# Patient Record
Sex: Male | Born: 1965 | Race: Black or African American | Hispanic: No | Marital: Single
Health system: Southern US, Community
[De-identification: ages and names within clinical notes are randomized; demographics above are authoritative.]

## PROBLEM LIST (undated history)

## (undated) DIAGNOSIS — I10 Essential (primary) hypertension: Secondary | ICD-10-CM

## (undated) HISTORY — PX: SHOULDER SURGERY: SHX246

---

## 2004-09-13 ENCOUNTER — Emergency Department (HOSPITAL_COMMUNITY): Admission: EM | Admit: 2004-09-13 | Discharge: 2004-09-14 | Payer: Self-pay | Admitting: Emergency Medicine

## 2006-07-04 ENCOUNTER — Emergency Department (HOSPITAL_COMMUNITY): Admission: EM | Admit: 2006-07-04 | Discharge: 2006-07-04 | Payer: Self-pay | Admitting: Emergency Medicine

## 2006-09-10 ENCOUNTER — Emergency Department (HOSPITAL_COMMUNITY): Admission: EM | Admit: 2006-09-10 | Discharge: 2006-09-11 | Payer: Self-pay | Admitting: Emergency Medicine

## 2007-01-25 ENCOUNTER — Ambulatory Visit: Payer: Self-pay | Admitting: Hospitalist

## 2007-01-25 ENCOUNTER — Inpatient Hospital Stay (HOSPITAL_COMMUNITY): Admission: EM | Admit: 2007-01-25 | Discharge: 2007-01-29 | Payer: Self-pay | Admitting: Emergency Medicine

## 2007-01-26 ENCOUNTER — Encounter (INDEPENDENT_AMBULATORY_CARE_PROVIDER_SITE_OTHER): Payer: Self-pay | Admitting: Hospitalist

## 2007-01-26 ENCOUNTER — Ambulatory Visit: Payer: Self-pay | Admitting: Surgery

## 2007-05-14 ENCOUNTER — Emergency Department (HOSPITAL_COMMUNITY): Admission: EM | Admit: 2007-05-14 | Discharge: 2007-05-15 | Payer: Self-pay | Admitting: Emergency Medicine

## 2008-12-26 ENCOUNTER — Emergency Department (HOSPITAL_COMMUNITY): Admission: EM | Admit: 2008-12-26 | Discharge: 2008-12-26 | Payer: Self-pay | Admitting: Emergency Medicine

## 2010-10-15 NOTE — Discharge Summary (Signed)
Donald French, Donald French              ACCOUNT NO.:  [id number]   MEDICAL RECORD NO.:  [id number]          PATIENT TYPE:  INP   LOCATION:  5019                         FACILITY:  [facility]   PHYSICIAN:  Fransisco Hertz, M.D.  DATE OF BIRTH:  06-Jan-1966   DATE OF ADMISSION:  01/25/2007  DATE OF DISCHARGE:  01/29/2007                               DISCHARGE SUMMARY   Left against medical advice on January 29, 2007.   DISCHARGE DIAGNOSES:  1. Methicillin-resistant Staphylococcus aureus positive abscess on      right knee.  2. Pseudogout.   MEDICATIONS:  The patient was supposed to complete a course of  doxycycline for his methicillin-resistant Staphylococcus aureus positive  abscess.   DISPOSITION AND FOLLOWUP:  The patient left against medical advice on  January 29, 2007.  We were planning to send him home on oral  antibiotics after doxycycline for his methicillin-resistant  Staphylococcus aureus positive abscess on the following day.   PROCEDURES:  1. The patient underwent diagnostic radiographic examination of his      right knee on January 25, 2007.  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  2. The patient underwent an MRI of the right knee without contrast on      January 25, 2007.  Impression:  1) hemorrhage into patellar bursa,      (2) small radial tear centrally in the body of the lateral      meniscus, (3) bipartite patella, (4) tiny Baker's cyst with a small      loose body within it, (5) tricompartmental degenerative disease.  3. A Doppler ultrasound of the right lower extremity was performed to      rule out deep venous thrombosis.  The results came back to be      negative.   CONSULTATIONS:  Orthopaedic consultation was done with Dr. Priscille Kluver.  Incision and  drainage of the abscess on the right knee was done, which  later grew methicillin-resistant Staphylococcus aureus positive culture.  A joint aspiration was also done at the time.  Fine needle fluid  examination from joint aspiration showed the following results:  Color  red, appearance bloody, WBC not done because the patient clotted,  neutrophils 99, lymphocytes 1, __________, eosinophils zero, bacteria  noted, crystalloid __________ intracellular calcium 500 crystals as well  as extracellular calcium 500 crystals.   BRIEF ADMISSION HISTORY AND PHYSICAL:  Donald French is a 45 year old  gentleman who presented to the emergency department on the day of  admission with 3-week history of right leg swelling.  The swelling  started when he got hit on the right knee by a metal grill while he was  trying to load it into his truck about 3 weeks ago.  He thought that it  would subside on its own, so did not think it was necessary for him to  come to the hospital.  The swelling had remained more or less persistent  until the day of admission when he noticed it to be growing larger and  causing more pain.  He became unable to bear weight on that side, so he  decided to come to the emergency department.  He denied any fever with  chills, or any other systemic problems.   PAST MEDICAL HISTORY:  Donald French did not have any significant past  medical history except for an injury to his left shoulder sustained  while playing a football game a long time ago.   ADMISSION DAY VITAL SIGNS:  Temperature 97.9, pulse 74, respirations 18,  systolic blood pressure 138/ diastolic blood pressure 88.  Oxygen  saturation 100% on room air.   GENERAL:  On general examination, the patient was comfortable, not in  any apparent distress.  HEAD, EYES, EARS, NOSE AND THROAT:  Normocephalic, atraumatic. No  icterus or pallor.  Extraocular movements intact.  Pupils are equal,  round, reactive to light and  accommodation.  NECK:  No lymphadenopathy, no thyromegaly, supple.  CHEST:  Clear to auscultation bilaterally.  CARDIOVASCULAR:  Regular rate and rhythm, normal first and second heart  sounds.  No murmurs, rubs, or gallops.  ABDOMEN:  Soft, nontender, nondistended.  Bowel sounds present.  No  palpable abnormal organomegaly or swelling.  EXTREMITIES:  About 5 cm swelling on the anterior portion of the right  knee was noted.  Swelling is surrounded by minimal erythema and swelling  was noted to the anterior portion of the right knee.  It was tender to  palpation, and because of the swelling, joint motion of the right knee  was limited to about 90 degrees of flexion.  Examination of the left  knee was unremarkable.  SKIN:  Normal with no rash, and normal turgor.  NEUROLOGIC:  Alert and oriented x3.  Cranial nerves II-XII are intact.  No focal neurological deficit.  PSYCHIATRIC:  Examination was appropriate.   ADMISSION DAY LABORATORY:  Hemoglobin 14.7, white cells 8.3, platelets  184.  Sodium 136, potassium 3.1, chloride 100, bicarbonate 22, glucose  106, BUN 9, creatinine 1.09, 1.3, alkaline phosphatase 43, AST 31, ALT  28, total protein 8.1, albumin 3.6, calcium 9.1.   BRIEF HOSPITAL COURSE BY PROBLEMS:  1. Right knee abscess:  The patient was initially treated with      intravenous fluids, intravenous antibiotics and analgesics.  He was      started on Rocephin on day #1, and on the following day, since the      abscess appeared to be spreading, vancomycin was added to the      regimen.  When we obtained the culture results, we stopped the      Rocephin and continued him on vancomycin, which was continued for 4      days until January 29, 2007, when we shifted him to oral      doxycycline based on the culture and sensitivity results.  The      patient also underwent an orthopaedic evaluation as mentioned      above, and we also performed a Doppler ultrasound of his right leg       to rule out any deep venous thrombosis.  The results came back as      negative.  Orthopaedic team did an incision and drainage of the      wound, and also performed regular dressing of the wound.  We are  planning to send him home after setting up an appointment to have      his wound cleaned and dressing done on a daily basis at home, but      unfortunately the patient decided to leave against medical advice      on January 29, 2007.  2. Pseudogout:  This was an incidental finding on synovial fluid      examination.  We decided not to initiate any anti-inflammatory at      this time.  This is to be reviewed by the patient's primary care      physician, or whoever happens to provide care to the patient next.  3. Hypokalemia:  The patient briefly developed hypokalemia while in      the hospital.  He was not on any diuretics, and was not having any      nausea, vomiting or diarrhea.  We just monitored him and gave him      potassium supplements.  He responded well, and his potassium level      also came back to normal after supplementation.   VITAL SIGNS ON DAY PATIENT LEFT HOSPITAL:  Temperature 98.1, pulse 86,  respirations 18, blood pressure 131/97, O2 saturation 90% on room air.   LABORATORY ON DAY PATIENT LEFT HOSPITAL:  Sodium 142, potassium 4,  chloride 109, bicarbonate 29, glucose 98, BUN 4, creatinine 1.17,  calcium 8.5, hemoglobin 12, WBC 4.4, platelet count 221.      Zara Council, MD       Fransisco Hertz, M.D.     AS/MEDQ  D:  01/30/2007  T:  01/30/2007  Job:  [id number]

## 2011-03-11 LAB — CBC
HCT: 35.6 — ABNORMAL LOW
HCT: 42.6
Hemoglobin: 13.1
Hemoglobin: 14.5
Hemoglobin: 14.7
MCHC: 33.7
MCHC: 33.6
MCHC: 33.8
MCHC: 34.1
MCV: 89
MCV: 89
Platelets: 184
RBC: 4.4
RBC: 4.93
RDW: 12.4
RDW: 12.4
WBC: 4.4
WBC: 7.9
WBC: 8.3

## 2011-03-11 LAB — BASIC METABOLIC PANEL
BUN: 4 — ABNORMAL LOW
CO2: 29
CO2: 27
CO2: 30
CO2: 30
Calcium: 8.5
Calcium: 8.8
Calcium: 9.2
Chloride: 109
Chloride: 104
Chloride: 104
Creatinine, Ser: 1.18
Creatinine, Ser: 1.25
GFR calc non Af Amer: >60
GFR calc non Af Amer: >60
GFR calc non Af Amer: >60
Glucose, Bld: 111 — ABNORMAL HIGH
Glucose, Bld: 98
Potassium: 4
Sodium: 142
Sodium: 139

## 2011-03-11 LAB — DIFFERENTIAL
Eosinophils Absolute: 0
Monocytes Relative: 10

## 2011-03-11 LAB — I-STAT 8, (EC8 V) (CONVERTED LAB)
Acid-Base Excess: 1
BUN: 11
Bicarbonate: 26.7 — ABNORMAL HIGH
Chloride: 103
HCT: 49
Operator id: 285491
Potassium: 3.8
Sodium: 137
pH, Ven: 7.402 — ABNORMAL HIGH

## 2011-03-11 LAB — CULTURE, ROUTINE-ABSCESS

## 2011-03-11 LAB — CULTURE, BLOOD (ROUTINE X 2)

## 2011-03-11 LAB — URINALYSIS, ROUTINE W REFLEX MICROSCOPIC
Hgb urine dipstick: NEGATIVE
Protein, ur: NEGATIVE
Urobilinogen, UA: 1

## 2011-03-11 LAB — COMPREHENSIVE METABOLIC PANEL
Calcium: 9.1
Chloride: 100
Creatinine, Ser: 1.09
Sodium: 136

## 2011-03-11 LAB — PROTIME-INR: INR: 1.1

## 2011-03-11 LAB — SYNOVIAL CELL COUNT + DIFF, W/ CRYSTALS: Eosinophils-Synovial: 0

## 2011-03-11 LAB — POCT I-STAT CREATININE: Operator id: 285491

## 2012-07-02 ENCOUNTER — Encounter (HOSPITAL_COMMUNITY): Payer: Self-pay | Admitting: *Deleted

## 2012-07-02 ENCOUNTER — Emergency Department (HOSPITAL_COMMUNITY)
Admission: EM | Admit: 2012-07-02 | Discharge: 2012-07-02 | Disposition: A | Discharge status: Home / Self Care | Payer: Self-pay | Attending: Emergency Medicine | Admitting: Emergency Medicine

## 2012-07-02 DIAGNOSIS — R059 Cough, unspecified: Secondary | ICD-10-CM | POA: Insufficient documentation

## 2012-07-02 DIAGNOSIS — R110 Nausea: Secondary | ICD-10-CM | POA: Insufficient documentation

## 2012-07-02 DIAGNOSIS — R51 Headache: Secondary | ICD-10-CM | POA: Insufficient documentation

## 2012-07-02 DIAGNOSIS — R05 Cough: Secondary | ICD-10-CM | POA: Insufficient documentation

## 2012-07-02 LAB — POCT I-STAT, CHEM 8
Creatinine, Ser: 1.4 mg/dL — ABNORMAL HIGH (ref 0.50–1.35)
Hemoglobin: 14.6 g/dL (ref 13.0–17.0)
Sodium: 142 mEq/L (ref 135–145)
TCO2: 30 mmol/L (ref 0–100)

## 2012-07-02 NOTE — ED Provider Notes (Signed)
History     CSN: [id number]  Arrival date & time 07/02/12  0023   First MD Initiated Contact with Patient 07/02/12 0141      Chief Complaint  Patient presents with  . Headache   HPI  History provided by the patient. Patient is a 47 year old male with no significant PMH who presents with complaints of gradually increasing headache. Headache began around 9 PM and gradually increased into a continually dull throbbing headache. Headache was not described as the most severe headache of his life or having any sudden onset. Patient did report having occasional episodes of shortness of breath and gasping symptoms. He does mention recent cough and congestion symptoms but states this was somewhat concerning to him and have normal. He denies any shortness of breath at this time. Shortness of breath was not persistent. He denied any pleuritic pains. Denies any hemoptysis. Headache was also associated with some nausea and chills. Patient denies any vomiting or diarrhea symptoms. He denies any recent travel or known sick contacts. Patient took a Tylenol one to 2 hours prior to arrival which may have helped relieve his symptoms. Currently he does not complain of any headache or other symptoms.     History reviewed. No pertinent past medical history.  Past Surgical History  Procedure Date  . Shoulder surgery     History reviewed. No pertinent family history.  History  Substance Use Topics  . Smoking status: Never Smoker   . Smokeless tobacco: Not on file  . Alcohol Use: Yes      Review of Systems  Constitutional: Positive for chills.  Respiratory: Positive for cough.   Gastrointestinal: Positive for nausea.  Neurological: Positive for headaches.  All other systems reviewed and are negative.    Allergies  Review of patient's allergies indicates no known allergies.  Home Medications   Current Outpatient Rx  Name  Route  Sig  Dispense  Refill  . ACETAMINOPHEN 500 MG PO TABS   Oral   Take 1,000 mg by mouth every 6 (six) hours as needed. For pain           BP 126/80  Pulse 60  Temp 97.7 F (36.5 C) (Oral)  SpO2 98%  Physical Exam  Nursing note and vitals reviewed. Constitutional: He is oriented to person, place, and time. He appears well-developed and well-nourished. No distress.  HENT:  Head: Normocephalic and atraumatic.  Mouth/Throat: Oropharynx is clear and moist.       No sinus pressure or nasal congestion  Eyes: Conjunctivae normal and EOM are normal. Pupils are equal, round, and reactive to light.  Neck: Normal range of motion. Neck supple.       No meningeal sign  Cardiovascular: Normal rate and regular rhythm.   No murmur heard. Pulmonary/Chest: Effort normal and breath sounds normal. No respiratory distress. He has no wheezes.  Abdominal: Soft. He exhibits no mass. There is no tenderness. There is no rebound and no guarding.  Musculoskeletal: Normal range of motion. He exhibits no edema and no tenderness.  Neurological: He is alert and oriented to person, place, and time. He has normal strength. No cranial nerve deficit or sensory deficit. Coordination and gait normal.  Skin: Skin is warm. No rash noted.  Psychiatric: He has a normal mood and affect. His behavior is normal.    ED Course  Procedures  Results for orders placed during the hospital encounter of 07/02/12  POCT I-STAT, CHEM 8      Component Value Range  Sodium 142  135 - 145 mEq/L   Potassium 3.8  3.5 - 5.1 mEq/L   Chloride 103  96 - 112 mEq/L   BUN 6  6 - 23 mg/dL   Creatinine, Ser 1.61 (*) 0.50 - 1.35 mg/dL   Glucose, Bld 096 (*) 70 - 99 mg/dL   Calcium, Ion 0.45  4.09 - 1.23 mmol/L   TCO2 30  0 - 100 mmol/L   Hemoglobin 14.6  13.0 - 17.0 g/dL   HCT 81.1  91.4 - 78.2 %        1. Headache       MDM  Patient seen and evaluated. Patient currently without symptoms appears well denies chest pain shortness of breath heart palpitations. Denies any continued headache.  Patient has normal nonfocal neuro exam.   I discussed with patient option for further evaluation of his symptoms and atypical headache symptoms. He has refused any CAT scan today at this time. He is agreeable to have i-STAT chemistry performed.  Labs show slightly elevated creatinine. BUN normal. Otherwise labs unremarkable. Patient continues to feel well without symptoms. Patient has been instructed to have a recheck of his creatinine level in one to 2 weeks. Also advised to drink plenty of fluids. If headache symptoms returned he is instructed to try Tylenol and ibuprofen. He was advised he is welcome to return at anytime if symptoms continue or worsen in anyway.          Angus Seller, PA 07/02/12 2015

## 2012-07-02 NOTE — ED Notes (Signed)
Patient reports headache, SOB and nausea x 1 day. Acute onset. Now resolved but patient wants to know "what caused the shortness of breath."

## 2012-07-02 NOTE — ED Notes (Signed)
Pt c/o headache, nausea, and SOB today.  Denies CP, fevers/chills

## 2012-07-03 NOTE — ED Provider Notes (Signed)
Medical screening examination/treatment/procedure(s) were performed by non-physician practitioner and as supervising physician I was immediately available for consultation/collaboration.    Vida Roller, MD 07/03/12 [phone]

## 2013-05-01 ENCOUNTER — Emergency Department (HOSPITAL_COMMUNITY): Payer: Self-pay

## 2013-05-01 ENCOUNTER — Encounter (HOSPITAL_COMMUNITY): Payer: Self-pay | Admitting: Emergency Medicine

## 2013-05-01 ENCOUNTER — Emergency Department (HOSPITAL_COMMUNITY)
Admission: EM | Admit: 2013-05-01 | Discharge: 2013-05-01 | Disposition: A | Discharge status: Home / Self Care | Payer: Self-pay | Attending: Emergency Medicine | Admitting: Emergency Medicine

## 2013-05-01 DIAGNOSIS — R0789 Other chest pain: Secondary | ICD-10-CM | POA: Insufficient documentation

## 2013-05-01 DIAGNOSIS — R079 Chest pain, unspecified: Secondary | ICD-10-CM

## 2013-05-01 DIAGNOSIS — M542 Cervicalgia: Secondary | ICD-10-CM | POA: Insufficient documentation

## 2013-05-01 LAB — POCT I-STAT TROPONIN I: Troponin i, poc: 0 ng/mL (ref 0.00–0.08)

## 2013-05-01 LAB — CBC
Hemoglobin: 14.1 g/dL (ref 13.0–17.0)
MCHC: 34.1 g/dL (ref 30.0–36.0)
Platelets: 166 10*3/uL (ref 150–400)
RDW: 12.5 % (ref 11.5–15.5)

## 2013-05-01 LAB — BASIC METABOLIC PANEL
BUN: 11 mg/dL (ref 6–23)
Calcium: 9.1 mg/dL (ref 8.4–10.5)
GFR calc Af Amer: 71 mL/min — ABNORMAL LOW (ref >90)
GFR calc non Af Amer: 61 mL/min — ABNORMAL LOW (ref >90)
Potassium: 3.7 mEq/L (ref 3.5–5.1)

## 2013-05-01 MED ORDER — ASPIRIN 81 MG PO CHEW
324.0000 mg | CHEWABLE_TABLET | Freq: Once | ORAL | Status: AC
  Administered 2013-05-01: 324 mg via ORAL
  Filled 2013-05-01: qty 4

## 2013-05-01 NOTE — ED Notes (Addendum)
Pt. reports intermittent mid chest pain onset yesterday , denies SOB , nausea and diaphoresis . Pt. also reports left arm numbness and back of neck stiffness . No fever or chills. No chest pain at arrival .

## 2013-05-01 NOTE — ED Notes (Signed)
Pt to ED for evaluation of left sided chest pain radiation to left arm, denies shortness of breath associated with symptoms.  Pt states 15 months ago he had pain similar to this that was dx as an anxiety attack but he is worried that this pain might be different.  Pt speaking in full sentences, alert and oriented X 4.

## 2013-05-01 NOTE — ED Provider Notes (Signed)
CSN: [id number]     Arrival date & time 05/01/13  0006 History   First MD Initiated Contact with Patient 05/01/13 0024     Chief Complaint  Patient presents with  . Chest Pain   (Consider location/radiation/quality/duration/timing/severity/associated sxs/prior Treatment) HPI Comments: 47 yo male with no medical hx, no cardiac risk factors, no PE risk factors presents with central/ left upper chest discomfort, burning, mild radiation to left arm (tingling), mild neck pain for two days (tension like).  Chest discomfort felt like general sensation over his chest since 11 pm tonight.  Milder version in the past, anxiety related felt.  No stress test hx.  No exertional or diaphoresis.  No cp currently.    Patient is a 47 y.o. male presenting with chest pain. The history is provided by the patient.  Chest Pain Associated symptoms: no abdominal pain, no back pain, no fever, no headache, no shortness of breath, not vomiting and no weakness     History reviewed. No pertinent past medical history. Past Surgical History  Procedure Laterality Date  . Shoulder surgery     No family history on file. History  Substance Use Topics  . Smoking status: Never Smoker   . Smokeless tobacco: Not on file  . Alcohol Use: Yes    Review of Systems  Constitutional: Negative for fever and chills.  HENT: Negative for congestion.   Eyes: Negative for visual disturbance.  Respiratory: Negative for shortness of breath.   Cardiovascular: Positive for chest pain. Negative for leg swelling.  Gastrointestinal: Negative for vomiting and abdominal pain.  Genitourinary: Negative for dysuria and flank pain.  Musculoskeletal: Positive for neck pain (very mild, bilateral posterior). Negative for back pain and neck stiffness.  Skin: Negative for rash.  Neurological: Negative for weakness, light-headedness and headaches.    Allergies  Review of patient's allergies indicates no known allergies.  Home Medications    Current Outpatient Rx  Name  Route  Sig  Dispense  Refill  . acetaminophen (TYLENOL) 500 MG tablet   Oral   Take 1,000 mg by mouth every 6 (six) hours as needed. For pain          BP 155/92  Pulse 78  Temp(Src) 98.1 F (36.7 C) (Oral)  Resp 16  SpO2 100% Physical Exam  Nursing note and vitals reviewed. Constitutional: He is oriented to person, place, and time. He appears well-developed and well-nourished.  HENT:  Head: Normocephalic and atraumatic.  Eyes: Conjunctivae are normal. Right eye exhibits no discharge. Left eye exhibits no discharge.  Neck: Normal range of motion. Neck supple. No tracheal deviation present.  Cardiovascular: Normal rate and regular rhythm.   No murmur heard. Pulmonary/Chest: Effort normal and breath sounds normal.  Abdominal: Soft. He exhibits no distension. There is no tenderness. There is no guarding.  Musculoskeletal: He exhibits no edema and no tenderness.  Neurological: He is alert and oriented to person, place, and time.  Skin: Skin is warm. No rash noted.  Psychiatric: He has a normal mood and affect.    ED Course  Procedures (including critical care time) Labs Review Labs Reviewed  BASIC METABOLIC PANEL - Abnormal; Notable for the following:    GFR calc non Af Amer 61 (*)    GFR calc Af Amer 71 (*)    All other components within normal limits  CBC  TROPONIN I  POCT I-STAT TROPONIN I   Imaging Review No results found.  EKG Interpretation   None  MUSE not working.  Date: 05/01/2013  Rate: 88  Rhythm: normal sinus rhythm  QRS Axis: indeterminate  Intervals: normal  ST/T Wave abnormalities: normal  Conduction Disutrbances:none  Narrative Interpretation:   Old EKG Reviewed: changes noted    MDM   1. Acute chest pain    Well appearing. Low risk acs.  PERC negative, no D dimer indicated, clinically not PE No cp in ED. Plan for delta troponin. Discussed close outpt fup for possible stress test, pt comfortable with  plan.  Results and differential diagnosis were discussed with the patient. Close follow up outpatient was discussed, patient comfortable with the plan.  Well appearing on recheck, no sxs.  Diagnosis: Atypical chest pain      Enid Skeens, MD 05/01/13 [phone]

## 2013-11-18 ENCOUNTER — Encounter (HOSPITAL_COMMUNITY): Payer: Self-pay | Admitting: Emergency Medicine

## 2013-11-18 ENCOUNTER — Emergency Department (HOSPITAL_COMMUNITY)
Admission: EM | Admit: 2013-11-18 | Discharge: 2013-11-18 | Disposition: A | Discharge status: Home / Self Care | Payer: Self-pay | Attending: Emergency Medicine | Admitting: Emergency Medicine

## 2013-11-18 DIAGNOSIS — Z79899 Other long term (current) drug therapy: Secondary | ICD-10-CM | POA: Insufficient documentation

## 2013-11-18 DIAGNOSIS — R600 Localized edema: Secondary | ICD-10-CM

## 2013-11-18 DIAGNOSIS — R609 Edema, unspecified: Secondary | ICD-10-CM | POA: Insufficient documentation

## 2013-11-18 DIAGNOSIS — I10 Essential (primary) hypertension: Secondary | ICD-10-CM | POA: Insufficient documentation

## 2013-11-18 LAB — URINALYSIS, ROUTINE W REFLEX MICROSCOPIC
BILIRUBIN URINE: NEGATIVE
GLUCOSE, UA: NEGATIVE mg/dL
HGB URINE DIPSTICK: NEGATIVE
KETONES UR: NEGATIVE mg/dL
LEUKOCYTES UA: NEGATIVE
Nitrite: NEGATIVE
PH: 6 (ref 5.0–8.0)
Protein, ur: NEGATIVE mg/dL
Specific Gravity, Urine: 1.023 (ref 1.005–1.030)
Urobilinogen, UA: 1 mg/dL (ref 0.0–1.0)

## 2013-11-18 LAB — CBC WITH DIFFERENTIAL/PLATELET
Basophils Absolute: 0 10*3/uL (ref 0.0–0.1)
Basophils Relative: 0 % (ref 0–1)
Eosinophils Absolute: 0.1 10*3/uL (ref 0.0–0.7)
Eosinophils Relative: 1 % (ref 0–5)
HEMATOCRIT: 39.4 % (ref 39.0–52.0)
HEMOGLOBIN: 13.1 g/dL (ref 13.0–17.0)
LYMPHS ABS: 2.7 10*3/uL (ref 0.7–4.0)
LYMPHS PCT: 50 % — AB (ref 12–46)
MCH: 29.8 pg (ref 26.0–34.0)
MCHC: 33.2 g/dL (ref 30.0–36.0)
MCV: 89.7 fL (ref 78.0–100.0)
MONO ABS: 0.4 10*3/uL (ref 0.1–1.0)
MONOS PCT: 7 % (ref 3–12)
NEUTROS ABS: 2.2 10*3/uL (ref 1.7–7.7)
Neutrophils Relative %: 42 % — ABNORMAL LOW (ref 43–77)
Platelets: 155 10*3/uL (ref 150–400)
RBC: 4.39 MIL/uL (ref 4.22–5.81)
RDW: 13.6 % (ref 11.5–15.5)
WBC: 5.3 10*3/uL (ref 4.0–10.5)

## 2013-11-18 LAB — I-STAT CHEM 8, ED
BUN: 12 mg/dL (ref 6–23)
CALCIUM ION: 1.15 mmol/L (ref 1.12–1.23)
CHLORIDE: 106 meq/L (ref 96–112)
CREATININE: 1.4 mg/dL — AB (ref 0.50–1.35)
GLUCOSE: 90 mg/dL (ref 70–99)
HCT: 43 % (ref 39.0–52.0)
Hemoglobin: 14.6 g/dL (ref 13.0–17.0)
Potassium: 3.9 mEq/L (ref 3.7–5.3)
Sodium: 144 mEq/L (ref 137–147)
TCO2: 25 mmol/L (ref 0–100)

## 2013-11-18 MED ORDER — FUROSEMIDE 20 MG PO TABS
20.0000 mg | ORAL_TABLET | Freq: Once | ORAL | Status: AC
  Administered 2013-11-18: 20 mg via ORAL
  Filled 2013-11-18: qty 1

## 2013-11-18 NOTE — Discharge Instructions (Signed)
Peripheral Edema You have swelling in your legs (peripheral edema). This swelling is due to excess accumulation of salt and water in your body. Edema may be a sign of heart, kidney or liver disease, or a side effect of a medication. It may also be due to problems in the leg veins. Elevating your legs and using special support stockings may be very helpful, if the cause of the swelling is due to poor venous circulation. Avoid long periods of standing, whatever the cause. Treatment of edema depends on identifying the cause. Chips, pretzels, pickles and other salty foods should be avoided. Restricting salt in your diet is almost always needed. Water pills (diuretics) are often used to remove the excess salt and water from your body via urine. These medicines prevent the kidney from reabsorbing sodium. This increases urine flow. Diuretic treatment may also result in lowering of potassium levels in your body. Potassium supplements may be needed if you have to use diuretics daily. Daily weights can help you keep track of your progress in clearing your edema. You should call your caregiver for follow up care as recommended. SEEK IMMEDIATE MEDICAL CARE IF:   You have increased swelling, pain, redness, or heat in your legs.  You develop shortness of breath, especially when lying down.  You develop chest or abdominal pain, weakness, or fainting.  You have a fever. Document Released: 06/23/2004 Document Revised: 08/08/2011 Document Reviewed: 06/03/2009 [facility] Patient Information 2015 [place], [place]. This information is not intended to replace advice given to you by your health care provider. Make sure you discuss any questions you have with your health care provider. Tonight your ae slightly hypertensive Please watch you salt intake, and make every effort to find a primary care physician     Emergency Department Resource Guide 1) Find a Doctor and Pay Out of Pocket Although you won't have to find out  who is covered by your insurance plan, it is a good idea to ask around and get recommendations. You will then need to call the office and see if the doctor you have chosen will accept you as a new patient and what types of options they offer for patients who are self-pay. Some doctors offer discounts or will set up payment plans for their patients who do not have insurance, but you will need to ask so you aren't surprised when you get to your appointment.  2) Contact Your Local Health Department Not all health departments have doctors that can see patients for sick visits, but many do, so it is worth a call to see if yours does. If you don't know where your local health department is, you can check in your phone book. The CDC also has a tool to help you locate your state's health department, and many state websites also have listings of all of their local health departments.  3) Find a Walk-in Clinic If your illness is not likely to be very severe or complicated, you may want to try a walk in clinic. These are popping up all over the country in pharmacies, drugstores, and shopping centers. They're usually staffed by nurse practitioners or physician assistants that have been trained to treat common illnesses and complaints. They're usually fairly quick and inexpensive. However, if you have serious medical issues or chronic medical problems, these are probably not your best option.  No Primary Care Doctor: - Call Health Connect at  [phone] - they can help you locate a primary care doctor that  accepts your  insurance, provides certain services, etc. - Physician Referral Service- [phone]  Chronic Pain Problems: Organization         Address  Phone   Notes  [facility]  [phone] Patients need to be referred by their primary care doctor.   Medication Assistance: Organization         Address  Phone   Notes  [facility] Medication [facility] [address]  [place] [place], [place] [address] [phone] --Must be a resident of [facility] -- Must have NO insurance coverage whatsoever (no Medicaid/ Medicare, etc.) -- The pt. MUST have a primary care doctor that directs their care regularly and follows them in the community   [facility]ist  [phone]   [employer]  [phone]    Agencies that provide inexpensive medical care: Organization         Address  Phone   Notes  [facility]  [phone]   [facility]    [phone]   [facility] [address] [place], [place] [address] [phone]   [facility] [employer] [facility] [address] [place] [place] [phone]   Planned Parenthood    [phone]   [facility]    [phone]   [facility]  [address], [place] Phone:  [phone], Fax:  [phone] Hours of Operation:  9 am - 6 pm, M-F.  Also accepts Medicaid/Medicare and self-pay.  [facility]  [address], [place] Phone: [phone], Fax: [phone]. Hours of Operation:  8:30 am - 5:30 pm, M-F.  Also accepts Medicaid and self-pay.  [facility] [place] [address], [place] Phone: [phone]   [facility] [address] Natasha Bence [place], [place] [phone], Ext. [phone] Mondays & Thursdays: 7-9 AM.  First 15 patients are seen on a first come, first serve basis.    Medicaid-accepting [facility] Providers:  Organization         Address  Phone   Notes  [facility] [address], [place] [phone] Also accepts self-pay patients.  [facility] [address] Laurell Josephs [place], [place]  [phone]   [facility] [address], [place] [phone]   [facility] [address] [place] [phone]   Renaye Rakers  [address], [place]   [phone] Only accepts [place] Access [place] patients after they have their name applied to their card.   Self-Pay (no insurance) in [facility]:  Organization         Address  Phone   Notes  Sickle Cell Patients, [facility] [address] [place], [place] [phone]   [facility] [address] [place], [place] [phone]   [facility] [place]  [address], [place] [phone]   [facility]/Dr. Osei-Bonsu  [address] [place] or [address], [place] [phone] Phone number for both [place] and [place] locations is the same.  [facility] [address] [facility] [phone]   [facility] [address], [place] or [address] [phone]   [facility] [phone]  [address], [place] [phone], phone; [phone]) [phone], fax Sees patients 1st and 3rd Saturday of every month.  Must not qualify for public or private insurance (i.e. Medicaid, Medicare, [place] Health Choice, Veterans' Benefits)  Household income should be no more than 200% of the poverty level The clinic cannot treat you if you are pregnant or think you are pregnant  Sexually transmitted diseases are not treated at the clinic.    Dental Care: Organization         Address  Phone  Notes  [facility] [address], [place] [phone] Accepts children up to age 48 who are enrolled in [place]icaid or [place] Health Choice; pregnant women with a Medicaid card; and children who have applied for Medicaid or [place] Health Choice, but were declined, whose parents can pay a reduced fee at time of service.  [facility] [place]  [address], [place] [phone]) [phone] Accepts children up to age 48  who are enrolled in [place]icaid or [place] Health Choice; pregnant women with a Medicaid card; and children who have applied for Medicaid or [place] Health Choice, but were declined, whose parents can pay a reduced fee at time of service.  [facility]  [address], [place] [phone] Patients are seen by appointment only. Walk-ins are not accepted. [facility] will see patients 48 years of age and older. Monday - Tuesday (8am-5pm) Most Wednesdays (8:30-5pm) $30 per visit, cash only  [facility]  [address], [facility] [place] [phone] Patients are seen by appointment only. Walk-ins are not accepted. [facility] will see patients 48 years of age and older. One Wednesday Evening (Monthly: Volunteer Based).  $30 per visit, cash only  [employer] [facility] [facility]  [phone] for adults; Children under age 184, call Graduate Pediatric Dentistry at [phone]) [phone]. Children aged 754-14, please call [phone] to request a pediatric application.  Dental services are provided in all areas of dental care including fillings, crowns and bridges, complete and partial dentures, implants, gum treatment, root canals, and extractions. Preventive care is also provided. Treatment is provided to both adults and children. Patients are selected via a lottery and there is often a waiting list.   [facility] [address], [place]  [phone]) [phone] [email or web address]   [facility] [address], [place], [place] [phone], Ext. [phone] Second and Fourth Thursday of each month, opens at 6:30 AM; Clinic ends at 9 AM.  Patients are seen on a first-come first-served basis, and a limited number are seen during each clinic.   [facility]  [address], [place], [place] [phone]   Eligibility Requirements You must have lived in [place], [place], or [place] counties for at least the last three months.    You cannot be eligible for state or federal sponsored [place] Cityhealthcare insurance, including CIGNAVeterans Administration, [place]icaid, or [employer] EntertainmentMedicare.   You generally cannot be eligible for healthcare insurance through your employer.    How to apply: Eligibility screenings are held every Tuesday and Wednesday afternoon from 1:00 pm until 4:00 pm. You do not need an appointment for the interview!  [facility] [address], [place], [place] [phone]   [facility]  [phone]   [facility]  [phone]   [facility]  [phone]  Behavioral Health Resources in the Community: Intensive Outpatient Programs Organization         Address  Phone  Notes  [facility] [address], [place], [place] [phone]   [facility] [address], [place], [place] [phone]   [facility] [address], [place], [place]  [phone]   [facility] [address],  [place], [place] [phone] or [phone]   Substance Abuse Resources Organization         Address  Phone  Notes  [facility]  [phone]   [facility]  [phone]   [facility]  [phone]   [facility]  [phone]   Residential & Outpatient Substance Abuse Program  [phone]   Psychological Services Organization         Address  Phone  Notes  [facility]  [phone]- [phone]   [facility]  [phone]- [phone]   [facility] [address], [place] [phone] or [phone]    Mobile Crisis Teams Organization         Address  Phone  Notes  [facility], Mobile Crisis Care Unit  [phone]   [facility]  [address] [place], [place] [phone]   Doristine LocksSharon DeEsch [address] [place] [phone]    Self-Help/Support  Groups Organization         Address  Phone             Notes  [facility] [place] - variety of support groups  336- [phone] Call for more information  Narcotics Anonymous (NA), [facility] [address], [place]  2 meetings at this location   Statisticianesidential Treatment Programs Organization         Address  Phone  Notes  [facility] [address],    [place]  [phone]   [facility]  [address], [place] [phone], [place], [place] [phone]   [facility] [address], [place] [phone] Admissions: 8am-3pm M-F  [facility] [address]    [place], [place] [phone]   [facility] [address], [place], [place] [phone]   [facility] [address]  [place], [place] [phone]   [facility] - Intensive Outpatient [address], [place], [place] [phone]   [facility] [address]  [place], [place] [phone] or [phone]   [facility] ([facility]) [address] [place], [place] [phone] Accepts Medicaid  [facility] [address]  [place] [phone] Substance Abuse/Addiction Treatment   [facility] Resources Organization         Address  Phone  Notes  [facility]  [phone]) [phone]   Angie FavaJulie Brannon, PhD [address] [place], [place]   [phone] or [phone](336) [phone]   [facility] [facility]   [address] [place], [place] [phone]   [facility] [address], [place], [place] [phone] Insurance/Medicaid/sponsorship through [facility]  [facility] [address]                                    [place], [place] [phone] Therapy/tele-psych/case  [facility] [address]   [place],  [place] [phone]    Dr. Lolly Mustache  [phone]   [facility] [place]  [employer] [facility] Dept. 1) [address], [place] 2) [address] [place] 3)  [address], [place] 912 [phone]  [phone]   [facility] Child Abuse Hotline 820-395-5881 or [phone] (After Hours)

## 2013-11-18 NOTE — ED Provider Notes (Signed)
CSN: [id number]     Arrival date & time 11/18/13  0046 History   First MD Initiated Contact with Patient 11/18/13 0145     Chief Complaint  Patient presents with  . feet pain      (Consider location/radiation/quality/duration/timing/severity/associated sxs/prior Treatment) HPI Comments: Patient reports, that for the past 3 or 4, days.  He's noticed some discomfort in his legs and feet.  He, states that they are swollen.  He's never had this problem before.  He has not tried any medication for discomfort.  He takes no medication on a regular basis.  He does not have her regular Dr. he, states, that he drinks a sixpack of beer daily and that is his only fluid consumption throughout the day.  Occasionally on a glass of water, but that is a rare occasion. He denies any chest pain, shortness of breath Dysuria, urinary frequency  The history is provided by the patient.    History reviewed. No pertinent past medical history. Past Surgical History  Procedure Laterality Date  . Shoulder surgery     No family history on file. History  Substance Use Topics  . Smoking status: Never Smoker   . Smokeless tobacco: Not on file  . Alcohol Use: Yes    Review of Systems  Constitutional: Negative for fever and chills.  Cardiovascular: Positive for leg swelling.  Genitourinary: Negative for dysuria and frequency.  Musculoskeletal: Positive for myalgias. Negative for joint swelling.  Neurological: Negative for dizziness and headaches.  All other systems reviewed and are negative.     Allergies  Review of patient's allergies indicates no known allergies.  Home Medications   Prior to Admission medications   Medication Sig Start Date End Date Taking? Authorizing Provider  Multiple Vitamin (MULTIVITAMIN WITH MINERALS) TABS tablet Take 1 tablet by mouth daily.   Yes Historical Provider, MD  Omega-3 Fatty Acids (FISH OIL PO) Take 1 capsule by mouth 2 (two) times daily.   Yes Historical Provider,  MD   BP 141/91  Pulse 55  Temp(Src) 97.7 F (36.5 C) (Oral)  Resp 16  Ht 6\' 4"  (1.93 m)  Wt 275 lb (124.739 kg)  BMI 33.49 kg/m2  SpO2 97% Physical Exam  Nursing note and vitals reviewed. Constitutional: He is oriented to person, place, and time. He appears well-developed and well-nourished.  HENT:  Head: Normocephalic.  Eyes: Pupils are equal, round, and reactive to light.  Cardiovascular: Normal rate and regular rhythm.   Pulmonary/Chest: Effort normal.  Musculoskeletal: Normal range of motion. He exhibits edema and tenderness.       Right ankle: He exhibits swelling. He exhibits normal range of motion. Tenderness.       Left ankle: He exhibits swelling. He exhibits normal range of motion. Tenderness.  Strong pulses, bilaterally, has minimal swelling to his feet No pre-tibial edema  Neurological: He is alert and oriented to person, place, and time.  Skin: Skin is warm.    ED Course  Procedures (including critical care time) Labs Review Labs Reviewed  CBC WITH DIFFERENTIAL - Abnormal; Notable for the following:    Neutrophils Relative % 42 (*)    Lymphocytes Relative 50 (*)    All other components within normal limits  I-STAT CHEM 8, ED - Abnormal; Notable for the following:    Creatinine, Ser 1.40 (*)    All other components within normal limits  URINALYSIS, ROUTINE W REFLEX MICROSCOPIC    Imaging Review No results found.   EKG Interpretation None  MDM  Patient is hypertensive to 155/99  He has been given 20 Lasix po and told to watch his salt intake and find a PCP  He has been given a resource list  Final diagnoses:  Edema extremities  Essential hypertension         Arman FilterGail K Schulz, NP 11/18/13 0423

## 2013-11-18 NOTE — ED Notes (Signed)
The pt is c/o bi-lateral feet pain and swelling for the past 3-4 days.  No known injury .  No previous history

## 2013-11-18 NOTE — ED Notes (Signed)
Pt reports 2-3 weeks of bilat plantar foot pain and bilat swelling to feet.  Pt wife reports he woke up tonight and they decided he should come in to be seen.

## 2013-11-18 NOTE — ED Notes (Signed)
Signature pad in room not working.  Pt verbalized understanding of all instructions.  Left ambulatory after refusing wheelchair.

## 2013-11-19 NOTE — ED Provider Notes (Signed)
Medical screening examination/treatment/procedure(s) were performed by non-physician practitioner and as supervising physician I was immediately available for consultation/collaboration.   EKG Interpretation None        Kathleen M McManus, DO 11/19/13 1825 

## 2014-04-10 ENCOUNTER — Encounter (HOSPITAL_COMMUNITY): Payer: Self-pay | Admitting: Emergency Medicine

## 2014-04-10 ENCOUNTER — Emergency Department (HOSPITAL_COMMUNITY)
Admission: EM | Admit: 2014-04-10 | Discharge: 2014-04-10 | Disposition: A | Discharge status: Home / Self Care | Payer: Self-pay | Attending: Emergency Medicine | Admitting: Emergency Medicine

## 2014-04-10 ENCOUNTER — Emergency Department (HOSPITAL_COMMUNITY): Payer: Self-pay

## 2014-04-10 DIAGNOSIS — Z79899 Other long term (current) drug therapy: Secondary | ICD-10-CM | POA: Insufficient documentation

## 2014-04-10 DIAGNOSIS — J209 Acute bronchitis, unspecified: Secondary | ICD-10-CM

## 2014-04-10 DIAGNOSIS — I10 Essential (primary) hypertension: Secondary | ICD-10-CM | POA: Insufficient documentation

## 2014-04-10 DIAGNOSIS — R059 Cough, unspecified: Secondary | ICD-10-CM

## 2014-04-10 DIAGNOSIS — J069 Acute upper respiratory infection, unspecified: Secondary | ICD-10-CM | POA: Diagnosis present

## 2014-04-10 DIAGNOSIS — R05 Cough: Secondary | ICD-10-CM

## 2014-04-10 HISTORY — DX: Essential (primary) hypertension: I10

## 2014-04-10 MED ORDER — HYDROCOD POLST-CHLORPHEN POLST 10-8 MG/5ML PO LQCR: 5.0000 mL | Freq: Every evening | ORAL | Status: DC | PRN

## 2014-04-10 MED ORDER — ALBUTEROL SULFATE HFA 108 (90 BASE) MCG/ACT IN AERS: 2.0000 | INHALATION_SPRAY | RESPIRATORY_TRACT | Status: DC | PRN

## 2014-04-10 NOTE — ED Notes (Signed)
Pt returned from xray

## 2014-04-10 NOTE — ED Provider Notes (Signed)
CSN: [id number]     Arrival date & time 04/10/14  0746 History   First MD Initiated Contact with Patient 04/10/14 [phone]     Chief Complaint  Patient presents with  . Cough     (Consider location/radiation/quality/duration/timing/severity/associated sxs/prior Treatment) HPI  Donald French is a 48 y.o. male with PMH of HTN presenting with 1 week's worth of tactile fevers, chills and cough productive of brown and white sputum that is at times thick and at times thin. Patient also with rhinorrhea and mild sore throat. He has tried over-the-counter remedies without much relief including Delsym. Patient also notes mild shortness of breath with lying down. No chest pain, nausea vomiting, abdominal pain or back pain. Issue without history of chronic lung disease, COPD, asthma or any history of smoking. Patient concerned today because of the persistent nature of cough. Patient never incarcerated, around any one with TB or traveled recently. Pt with sick contacts.   Past Medical History  Diagnosis Date  . Hypertension    Past Surgical History  Procedure Laterality Date  . Shoulder surgery     History reviewed. No pertinent family history. History  Substance Use Topics  . Smoking status: Never Smoker   . Smokeless tobacco: Not on file  . Alcohol Use: Yes    Review of Systems  Constitutional: Positive for fever and chills.  HENT: Positive for rhinorrhea. Negative for congestion.   Eyes: Negative for visual disturbance.  Respiratory: Positive for cough and shortness of breath.   Cardiovascular: Negative for chest pain and palpitations.  Gastrointestinal: Negative for nausea, vomiting and diarrhea.  Musculoskeletal: Negative for back pain and gait problem.  Skin: Negative for rash.  Neurological: Negative for weakness and headaches.      Allergies  Review of patient's allergies indicates no known allergies.  Home Medications   Prior to Admission medications   Medication Sig  Start Date End Date Taking? Authorizing Provider  cetirizine (ZYRTEC) 10 MG tablet Take 10 mg by mouth daily.   Yes Historical Provider, MD  dextromethorphan (DELSYM) 30 MG/5ML liquid Take 60 mg by mouth 2 (two) times daily as needed for cough.   Yes Historical Provider, MD  hydrochlorothiazide (HYDRODIURIL) 25 MG tablet Take 25 mg by mouth daily.   Yes Historical Provider, MD  Multiple Vitamin (MULTIVITAMIN WITH MINERALS) TABS tablet Take 1 tablet by mouth daily.   Yes Historical Provider, MD  Omega-3 Fatty Acids (FISH OIL PO) Take 1 capsule by mouth 2 (two) times daily.   Yes Historical Provider, MD  albuterol (PROVENTIL HFA;VENTOLIN HFA) 108 (90 BASE) MCG/ACT inhaler Inhale 2 puffs into the lungs every 4 (four) hours as needed for wheezing or shortness of breath. 04/10/14   Louann SjogrenVictoria L Kenslei Hearty, PA-C  chlorpheniramine-HYDROcodone (TUSSIONEX PENNKINETIC ER) 10-8 MG/5ML LQCR Take 5 mLs by mouth at bedtime as needed for cough. 04/10/14   Benetta SparVictoria L Allisen Pidgeon, PA-C   BP 109/71 mmHg  Pulse 73  Temp(Src) 98.1 F (36.7 C) (Oral)  Resp 16  Ht 6\' 4"  (1.93 m)  Wt 255 lb (115.667 kg)  BMI 31.05 kg/m2  SpO2 99% Physical Exam  Constitutional: He appears well-developed and well-nourished. No distress.  HENT:  Head: Normocephalic and atraumatic.  Nose: Right sinus exhibits no maxillary sinus tenderness and no frontal sinus tenderness. Left sinus exhibits no maxillary sinus tenderness and no frontal sinus tenderness.  Mouth/Throat: Mucous membranes are normal. Posterior oropharyngeal erythema present. No oropharyngeal exudate or posterior oropharyngeal edema.  Eyes: Conjunctivae and EOM are  normal. Right eye exhibits no discharge. Left eye exhibits no discharge.  Neck: Normal range of motion. Neck supple.  Cardiovascular: Normal rate, regular rhythm and normal heart sounds.   Pulmonary/Chest: Effort normal and breath sounds normal. No respiratory distress. He has no wheezes. He has no rales.  Abdominal:  Soft. Bowel sounds are normal. He exhibits no distension. There is no tenderness.  Lymphadenopathy:    He has cervical adenopathy.  Neurological: He is alert.  Skin: Skin is warm and dry. He is not diaphoretic.  Nursing note and vitals reviewed.   ED Course  Procedures (including critical care time) Labs Review Labs Reviewed - No data to display  Imaging Review Dg Chest 2 View  04/10/2014   CLINICAL DATA:  Productive cough, shortness of breath, chills, sweats for 1 week, initial evaluation  EXAM: CHEST  2 VIEW  COMPARISON:  05/01/2013  FINDINGS: The heart size and mediastinal contours are within normal limits. Both lungs are clear. The visualized skeletal structures are unremarkable.  IMPRESSION: No active cardiopulmonary disease.   Electronically Signed   By: Esperanza Heiraymond  Rubner M.D.   On: 04/10/2014 08:18     EKG Interpretation None      Meds given in ED:  Medications - No data to display  Discharge Medication List as of 04/10/2014  9:00 AM    START taking these medications   Details  albuterol (PROVENTIL HFA;VENTOLIN HFA) 108 (90 BASE) MCG/ACT inhaler Inhale 2 puffs into the lungs every 4 (four) hours as needed for wheezing or shortness of breath., Starting 04/10/2014, Until Discontinued, Print    chlorpheniramine-HYDROcodone (TUSSIONEX PENNKINETIC ER) 10-8 MG/5ML LQCR Take 5 mLs by mouth at bedtime as needed for cough., Starting 04/10/2014, Until Discontinued, Print          MDM   Final diagnoses:  Acute bronchitis, unspecified organism   Pt CXR negative for acute infiltrate. VSS. o2 stat 98-99%. Patient in no respiratory distress. Patients symptoms are consistent with acute bronchitis, likely viral etiology. Discussed that antibiotics are not indicated for viral infections. Pt will be discharged with symptomatic treatment.  Verbalizes understanding and is agreeable with plan. Pt is hemodynamically stable & in NAD prior to dc. Pt to follow up with the wellness center as  needed.  Discussed return precautions with patient. Discussed all results and patient verbalizes understanding and agrees with plan.      Louann SjogrenVictoria L Lewis Grivas, PA-C 04/10/14 1928  Flint MelterElliott L Wentz, MD 04/11/14 [phone]

## 2014-04-10 NOTE — Discharge Instructions (Signed)
Return to the emergency room with worsening of symptoms, new symptoms or with symptoms that are concerning , especially fevers, stiff neck, worsening headache, nausea/vomiting, visual changes or slurred speech, chest pain, shortness of breath, cough with thick colored mucous or blood Drink plenty of fluids with electrolytes especially Gatorade. OTC cold medications such as mucinex, nyquil, dayquil are recommended. Chloraseptic for sore throat. Cough syrup at bed time. Do not operate machinery, drive or drink alcohol while taking this cough syrup, narcotics or muscle relaxers.  Please call the number below under ED resources to establish care with a provider and follow up.   Emergency Department Resource Guide 1) Find a Doctor and Pay Out of Pocket Although you won't have to find out who is covered by your insurance plan, it is a good idea to ask around and get recommendations. You will then need to call the office and see if the doctor you have chosen will accept you as a new patient and what types of options they offer for patients who are self-pay. Some doctors offer discounts or will set up payment plans for their patients who do not have insurance, but you will need to ask so you aren't surprised when you get to your appointment.  2) Contact Your Local Health Department Not all health departments have doctors that can see patients for sick visits, but many do, so it is worth a call to see if yours does. If you don't know where your local health department is, you can check in your phone book. The CDC also has a tool to help you locate your state's health department, and many state websites also have listings of all of their local health departments.  3) Find a Walk-in Clinic If your illness is not likely to be very severe or complicated, you may want to try a walk in clinic. These are popping up all over the country in pharmacies, drugstores, and shopping centers. They're usually staffed by nurse  practitioners or physician assistants that have been trained to treat common illnesses and complaints. They're usually fairly quick and inexpensive. However, if you have serious medical issues or chronic medical problems, these are probably not your best option.  No Primary Care Doctor: - Call Health Connect at  [phone] - they can help you locate a primary care doctor that  accepts your insurance, provides certain services, etc. - Physician Referral Service- [phone]  Chronic Pain Problems: Organization         Address  Phone   Notes  [facility]  [phone] Patients need to be referred by their primary care doctor.   Medication Assistance: Organization         Address  Phone   Notes  [facility] Medication [facility] [address] [place], [place] [address] [phone] --Must be a resident of [place] [place] -- Must have NO insurance coverage whatsoever (no Medicaid/ Medicare, etc.) -- The pt. MUST have a primary care doctor that directs their care regularly and follows them in the community   MedAssist  [phone]   [employer]  [phone]    Agencies that provide inexpensive medical care: Organization         Address  Phone   Notes  [facility]  [phone]   [facility]    [phone]) [phone]   [facility] [address] [place], [place] [address] [phone]) [phone]   [facility] [address], [place] [phone]   [facility]anned Parenthood    [phone]   [facility]    [phone]) [phone]   [facility]  [address], [place] Phone:  [phone](336) [phone], Fax:  [phone] Hours of Operation:  9 am - 6 pm, M-F.  Also accepts Medicaid/Medicare and self-pay.  [facility]  [address], [place] Phone: [phone]) [phone], Fax: [phone]. Hours of Operation:  8:30 am -  5:30 pm, M-F.  Also accepts Medicaid and self-pay.  [facility] [place] [address], [place] Phone: [phone]) [phone]   [facility] [address], [place], [place] [phone], Ext. [phone] Mondays & Thursdays: 7-9 AM.  First 15 patients are seen on a first come, first serve basis.    Medicaid-accepting [facility] Providers:  Organization         Address  Phone   Notes  [facility] [address], [place] [phone] Also accepts self-pay patients.  [facility] [address], [place]  [phone]   [facility] [address], [place] [phone]   [facility] [address] [place] [address], [place] [phone]   Renaye RakersVeita Bland [address], [place]   [phone] Only accepts [place] Access [place] patients after they have their name applied to their card.   Self-Pay (no insurance) in [facility]:  Organization         Address  Phone   Notes  Sickle Cell Patients, [facility] [address], [place] [phone]) [phone]   [facility] [address], [place] [phone]) [phone]   [facility] [place]  [address], [place] [phone]   [facility]/Dr. Osei-Bonsu  [address] [place] [address], [place] or [phone], [place] [phone] Phone number for both [place] and [place] locations is the same.  [facility] [address], [place] [phone]   [facility] [address] [place] [address], [place] or [address] [phone]) [phone]   [facility] [address], [place] [phone], phone; [phone], fax Sees patients 1st and 3rd Saturday of every month.  Must not qualify for public or private insurance (i.e. Medicaid, Medicare, [place] Health Choice,  Veterans' Benefits)  Household income should be no more than 200% of the poverty level The clinic cannot treat you if you are pregnant or think you are pregnant  Sexually transmitted diseases are not treated at the clinic.    Dental Care: Organization         Address  Phone  Notes  [facility] [address], [place] [phone] Accepts children up to age 48 who are enrolled in [place] or [place] Health Choice; pregnant women with a Medicaid card; and children who have applied for Medicaid or [place] Health Choice, but were declined, whose parents can pay a reduced fee at time of service.  [facility] of [facility] [place]  [address], [place] [phone] Accepts children up to age 48 who are enrolled in [place] or [place] Health Choice; pregnant women with a Medicaid card; and children who  have applied for Medicaid or [place] Health Choice, but were declined, whose parents can pay a reduced fee at time of service.  [facility]  [address] [place], [place] [phone] Patients are seen by appointment only. Walk-ins are not accepted. [facility] will see patients 52 years of age and older. Monday - Tuesday (8am-5pm) Most Wednesdays (8:30-5pm) $30 per visit, cash only  [facility] Adult [facility] Access PROGRAM  [address], [facility] [phone] Patients are seen by appointment only. Walk-ins are not accepted. [facility] will see patients 46 years of age and older. One Wednesday Evening (Monthly: Volunteer Based).  $30 per visit, cash only  [employer]  [phone] for adults; Children under age 39, call Graduate Pediatric Dentistry at [phone]. Children aged 65-14, please call [phone] to request a pediatric application.  Dental services are provided in all areas of dental care including fillings, crowns and bridges, complete and  partial dentures, implants, gum treatment, root canals, and extractions. Preventive care is also provided. Treatment is provided to both adults and children. Patients are selected via a lottery and there is often a waiting list.   [facility] [address] [place]  [phone] [email or web address]   [facility] [address] [place], [place] [phone], Ext. [phone] Second and Fourth Thursday of each month, opens at 6:30 AM; Clinic ends at 9 AM.  Patients are seen on a first-come first-served basis, and a limited number are seen during each clinic.   [facility]  [address] [place] [place], [place] [phone]   Eligibility Requirements You must have lived in [place], [place], or [place] counties for at least the last three months.   You cannot be eligible for state or federal sponsored [place], including [employer], [place], or [employer].   You generally cannot be eligible for healthcare insurance through your employer.    How to apply: Eligibility screenings are held every Tuesday and Wednesday afternoon from 1:00 pm until 4:00 pm. You do not need an appointment for the interview!  [facility] [address], [place], [place] [phone]   [facility] Health Department  [phone]   [facility] Health Department  [phone]   [facility] Health Department  [phone]    Behavioral Health Resources in the Community: Intensive Outpatient Programs Organization         Address  Phone  Notes  [facility] [address], [place], [place] [phone]   [facility] Outpatient [address], [place], [place] [phone]   [facility]: [facility] [address] [place], [place]  [phone]   [facility] Mental Health [address]  [place], [place] [phone] or [phone]   Substance Abuse Resources Organization          Address  Phone  Notes  Alcohol [facility] Drug Services  [phone]   [facility]  [phone]   [facility]  [phone]   Floydene Flock  [phone]   Residential & Outpatient Substance Abuse Program  [phone]   Psychological Services Organization         Address  Phone  Notes  [facility] Behavioral Health  [phone]   [facility] Services  [phone]   [facility] Mental Health [address] [place] [phone] or [phone]    Mobile Crisis Teams Organization  Address  Phone  Notes  [facility], Mobile Crisis Care Unit  [phone]   [facility]  [address] [place], [place] [phone]   [facility] [address] [place] [phone]    Self-Help/Support Groups Organization         Address  Phone             Notes  [facility] [place] - variety of support groups  [phone]- [phone] Call for more information  Narcotics Anonymous (NA), [facility] [address], [employer] [place]  2 meetings at this location   Statistician         Address  Phone  Notes  [facility] [address],    [place]  [phone]   [facility]  [address] [place] [address], [place], [place] [phone]   [facility] [address] [place], [place] [phone] Admissions: 8am-3pm M-F  [facility] [address],    [place], [place] [phone]   [facility] [address] [place], [place], [place] [phone]   [facility] [address],  [place], [place] [phone]   [facility] - Intensive Outpatient [address], [place], [place] [phone]   [facility] [address]  [place], [place] [phone] or [phone]   [facility] ([facility]) [address], [place], [place]  [phone] Accepts Medicaid  [facility] [address],  [place] [phone] Substance Abuse/Addiction Treatment   [facility] Organization         Address  Phone  Notes  [facility]  [phone]   Angie Fava, PhD [address] [place], [place]   [phone] or [phone]   [facility] Behavioral   [address] [place], [place] [phone]   [facility] [address] [place], [place] [phone] Insurance/Medicaid/sponsorship through [facility] Families [address]                                    [place], [place] [phone] Therapy/tele-psych/case  [facility] [address], [place] [phone]    Dr. Lolly Mustache  [phone]   [facility]  [employer] [facility] Dept. 1) [address] [place] 2) [address] [place] 3)  [address], [place] [phone]  [phone]   [facility] Child Abuse Hotline 803-069-4604 or [phone] (After Hours)

## 2014-04-10 NOTE — ED Notes (Signed)
Pt arrives POV from home with 1 week hx of cough with brown and white sputum. Denies fever. Resp clear, even, unlabored. As tried OTC remedies with little relief. VSS.

## 2014-04-11 ENCOUNTER — Encounter: Payer: Self-pay | Admitting: Internal Medicine

## 2014-04-11 ENCOUNTER — Ambulatory Visit: Payer: Self-pay | Attending: Internal Medicine | Admitting: Internal Medicine

## 2014-04-11 VITALS — BP 120/80 | HR 65 | Temp 98.3°F | Resp 16 | Wt 255.0 lb

## 2014-04-11 DIAGNOSIS — R319 Hematuria, unspecified: Secondary | ICD-10-CM

## 2014-04-11 DIAGNOSIS — I10 Essential (primary) hypertension: Secondary | ICD-10-CM

## 2014-04-11 DIAGNOSIS — R350 Frequency of micturition: Secondary | ICD-10-CM

## 2014-04-11 DIAGNOSIS — J069 Acute upper respiratory infection, unspecified: Secondary | ICD-10-CM

## 2014-04-11 DIAGNOSIS — Z139 Encounter for screening, unspecified: Secondary | ICD-10-CM

## 2014-04-11 DIAGNOSIS — N289 Disorder of kidney and ureter, unspecified: Secondary | ICD-10-CM

## 2014-04-11 DIAGNOSIS — Z113 Encounter for screening for infections with a predominantly sexual mode of transmission: Secondary | ICD-10-CM

## 2014-04-11 DIAGNOSIS — Z114 Encounter for screening for human immunodeficiency virus [HIV]: Secondary | ICD-10-CM | POA: Insufficient documentation

## 2014-04-11 DIAGNOSIS — R61 Generalized hyperhidrosis: Secondary | ICD-10-CM | POA: Insufficient documentation

## 2014-04-11 DIAGNOSIS — Z0000 Encounter for general adult medical examination without abnormal findings: Secondary | ICD-10-CM

## 2014-04-11 DIAGNOSIS — Z8042 Family history of malignant neoplasm of prostate: Secondary | ICD-10-CM

## 2014-04-11 LAB — POCT URINALYSIS DIPSTICK
Bilirubin, UA: NEGATIVE
Glucose, UA: NEGATIVE
Ketones, UA: NEGATIVE
Leukocytes, UA: NEGATIVE
NITRITE UA: NEGATIVE
PH UA: 5.5
Protein, UA: NEGATIVE
SPEC GRAV UA: 1.015
Urobilinogen, UA: 0.2

## 2014-04-11 LAB — GLUCOSE, POCT (MANUAL RESULT ENTRY): POC Glucose: 105 mg/dl — AB (ref 70–99)

## 2014-04-11 LAB — POCT GLYCOSYLATED HEMOGLOBIN (HGB A1C): Hemoglobin A1C: 5.5

## 2014-04-11 MED ORDER — AMLODIPINE BESYLATE 2.5 MG PO TABS: 2.5000 mg | ORAL_TABLET | Freq: Every day | ORAL | Status: DC

## 2014-04-11 NOTE — Patient Instructions (Signed)
DASH Eating Plan °DASH stands for "Dietary Approaches to Stop Hypertension." The DASH eating plan is a healthy eating plan that has been shown to reduce high blood pressure (hypertension). Additional health benefits may include reducing the risk of type 2 diabetes mellitus, heart disease, and stroke. The DASH eating plan may also help with weight loss. °WHAT DO I NEED TO KNOW ABOUT THE DASH EATING PLAN? °For the DASH eating plan, you will follow these general guidelines: °· Choose foods with a percent daily value for sodium of less than 5% (as listed on the food label). °· Use salt-free seasonings or herbs instead of table salt or sea salt. °· Check with your health care provider or pharmacist before using salt substitutes. °· Eat lower-sodium products, often labeled as "lower sodium" or "no salt added." °· Eat fresh foods. °· Eat more vegetables, fruits, and low-fat dairy products. °· Choose whole grains. Look for the word "whole" as the first word in the ingredient list. °· Choose fish and skinless chicken or turkey more often than red meat. Limit fish, poultry, and meat to 6 oz (170 g) each day. °· Limit sweets, desserts, sugars, and sugary drinks. °· Choose heart-healthy fats. °· Limit cheese to 1 oz (28 g) per day. °· Eat more home-cooked food and less restaurant, buffet, and fast food. °· Limit fried foods. °· Cook foods using methods other than frying. °· Limit canned vegetables. If you do use them, rinse them well to decrease the sodium. °· When eating at a restaurant, ask that your food be prepared with less salt, or no salt if possible. °WHAT FOODS CAN I EAT? °Seek help from a dietitian for individual calorie needs. °Grains °Whole grain or whole wheat bread. Brown rice. Whole grain or whole wheat pasta. Quinoa, bulgur, and whole grain cereals. Low-sodium cereals. Corn or whole wheat flour tortillas. Whole grain cornbread. Whole grain crackers. Low-sodium crackers. °Vegetables °Fresh or frozen vegetables  (raw, steamed, roasted, or grilled). Low-sodium or reduced-sodium tomato and vegetable juices. Low-sodium or reduced-sodium tomato sauce and paste. Low-sodium or reduced-sodium canned vegetables.  °Fruits °All fresh, canned (in natural juice), or frozen fruits. °Meat and Other Protein Products °Ground beef (85% or leaner), grass-fed beef, or beef trimmed of fat. Skinless chicken or turkey. Ground chicken or turkey. Pork trimmed of fat. All fish and seafood. Eggs. Dried beans, peas, or lentils. Unsalted nuts and seeds. Unsalted canned beans. °Dairy °Low-fat dairy products, such as skim or 1% milk, 2% or reduced-fat cheeses, low-fat ricotta or cottage cheese, or plain low-fat yogurt. Low-sodium or reduced-sodium cheeses. °Fats and Oils °Tub margarines without trans fats. Light or reduced-fat mayonnaise and salad dressings (reduced sodium). Avocado. Safflower, olive, or canola oils. Natural peanut or almond butter. °Other °Unsalted popcorn and pretzels. °The items listed above may not be a complete list of recommended foods or beverages. Contact your dietitian for more options. °WHAT FOODS ARE NOT RECOMMENDED? °Grains °White bread. White pasta. White rice. Refined cornbread. Bagels and croissants. Crackers that contain trans fat. °Vegetables °Creamed or fried vegetables. Vegetables in a cheese sauce. Regular canned vegetables. Regular canned tomato sauce and paste. Regular tomato and vegetable juices. °Fruits °Dried fruits. Canned fruit in light or heavy syrup. Fruit juice. °Meat and Other Protein Products °Fatty cuts of meat. Ribs, chicken wings, bacon, sausage, bologna, salami, chitterlings, fatback, hot dogs, bratwurst, and packaged luncheon meats. Salted nuts and seeds. Canned beans with salt. °Dairy °Whole or 2% milk, cream, half-and-half, and cream cheese. Whole-fat or sweetened yogurt. Full-fat   cheeses or blue cheese. Nondairy creamers and whipped toppings. Processed cheese, cheese spreads, or cheese  curds. °Condiments °Onion and garlic salt, seasoned salt, table salt, and sea salt. Canned and packaged gravies. Worcestershire sauce. Tartar sauce. Barbecue sauce. Teriyaki sauce. Soy sauce, including reduced sodium. Steak sauce. Fish sauce. Oyster sauce. Cocktail sauce. Horseradish. Ketchup and mustard. Meat flavorings and tenderizers. Bouillon cubes. Hot sauce. Tabasco sauce. Marinades. Taco seasonings. Relishes. °Fats and Oils °Butter, stick margarine, lard, shortening, ghee, and bacon fat. Coconut, palm kernel, or palm oils. Regular salad dressings. °Other °Pickles and olives. Salted popcorn and pretzels. °The items listed above may not be a complete list of foods and beverages to avoid. Contact your dietitian for more information. °WHERE CAN I FIND MORE INFORMATION? °National Heart, Lung, and [employer] Institute: www.nhlbi.nih.gov/health/health-topics/topics/dash/ °Document Released: 05/05/2011 Document Revised: 09/30/2013 Document Reviewed: 03/20/2013 °ExitCare® Patient Information ©2015 [employer]. This information is not intended to replace advice given to you by your health care provider. Make sure you discuss any questions you have with your health care provider. ° °

## 2014-04-11 NOTE — Progress Notes (Signed)
Patient was seen yesterday in the ED and diagnosed with Bronchitis Patient complains of feeling clammy, sweaty Patient also states his urine flow is weak and he tends to have to strain Patient is not quite sure what is going on with him and is getting nervous

## 2014-04-11 NOTE — Progress Notes (Signed)
Patient Demographics  Donald French, is a 48 y.o. male  ZOX:[id number]  [id number]  DOB - February 18, 1966  CC:  Chief Complaint  Patient presents with  . Excessive Sweating       HPI: Donald French is a 48 y.o. male here today to establish medical care. Patient has history of hypertension and has been on hydrochlorothiazide, recently went to the emergency room with URI symptoms, EMR reviewed patient had a chest x-ray done which was negative for any infiltrates, is taking cough medication when necessary does complain of excess sweating and chills, also complaining of urinary frequency, nocturia, patient does have family history of prostate cancer and is worried, previous blood work reviewed noticed renal insufficiency, today his blood pressure manual is 120/80 as per patient he took the blood pressure medication today, patient denies any change in weight or appetite  denies any nausea vomiting or change in bowel habits.patient also requesting to be checked for HIV and STDs. Patient has No headache, No chest pain, No abdominal pain - No Nausea, No new weakness tingling or numbness, No Cough - SOB.  No Known Allergies Past Medical History  Diagnosis Date  . Hypertension    Current Outpatient Prescriptions on File Prior to Visit  Medication Sig Dispense Refill  . albuterol (PROVENTIL HFA;VENTOLIN HFA) 108 (90 BASE) MCG/ACT inhaler Inhale 2 puffs into the lungs every 4 (four) hours as needed for wheezing or shortness of breath. 1 Inhaler 0  . cetirizine (ZYRTEC) 10 MG tablet Take 10 mg by mouth daily.    . chlorpheniramine-HYDROcodone (TUSSIONEX PENNKINETIC ER) 10-8 MG/5ML LQCR Take 5 mLs by mouth at bedtime as needed for cough. 115 mL 0  . dextromethorphan (DELSYM) 30 MG/5ML liquid Take 60 mg by mouth 2 (two) times daily as needed for cough.    . hydrochlorothiazide (HYDRODIURIL) 25 MG tablet Take 25 mg by mouth daily.    . Multiple Vitamin (MULTIVITAMIN WITH MINERALS) TABS tablet  Take 1 tablet by mouth daily.    . Omega-3 Fatty Acids (FISH OIL PO) Take 1 capsule by mouth 2 (two) times daily.     No current facility-administered medications on file prior to visit.   Family History  Problem Relation Age of Onset  . Hypertension Mother   . Hypertension Father   . Diabetes Father   . Cancer Paternal Grandmother     lung cancer   History   Social History  . Marital Status: Single    Spouse Name: N/A    Number of Children: N/A  . Years of Education: N/A   Occupational History  . Not on file.   Social History Main Topics  . Smoking status: Never Smoker   . Smokeless tobacco: Not on file  . Alcohol Use: Yes  . Drug Use: No  . Sexual Activity: Not on file   Other Topics Concern  . Not on file   Social History Narrative    Review of Systems: Constitutional: Negative for fever, chills, diaphoresis, activity change, appetite change and fatigue. HENT: Negative for ear pain, nosebleeds, congestion, facial swelling, rhinorrhea, neck pain, neck stiffness and ear discharge.  Eyes: Negative for pain, discharge, redness, itching and visual disturbance. Respiratory: Negative for cough, choking, chest tightness, shortness of breath, wheezing and stridor.  Cardiovascular: Negative for chest pain, palpitations and leg swelling. Gastrointestinal: Negative for abdominal distention. Genitourinary: Negative for dysuria, urgency, frequency, hematuria, flank pain, decreased urine volume, difficulty urinating and dyspareunia.  Musculoskeletal: Negative for back pain, joint  swelling, arthralgia and gait problem. Neurological: Negative for dizziness, tremors, seizures, syncope, facial asymmetry, speech difficulty, weakness, light-headedness, numbness and headaches.  Hematological: Negative for adenopathy. Does not bruise/bleed easily. Psychiatric/Behavioral: Negative for hallucinations, behavioral problems, confusion, dysphoric mood, decreased concentration and agitation.     Objective:   Filed Vitals:   04/11/14 1706  BP: 120/80  Pulse:   Temp:   Resp:     Physical Exam: Constitutional: Patient appears well-developed and well-nourished. No distress. HENT: Normocephalic, atraumatic, External right and left ear normal. Oropharynx is clear and moist.  Eyes: Conjunctivae and EOM are normal. PERRLA, no scleral icterus. Neck: Normal ROM. Neck supple. No JVD. No tracheal deviation. No thyromegaly. CVS: RRR, S1/S2 +, no murmurs, no gallops, no carotid bruit.  Pulmonary: Effort and breath sounds normal, no stridor, rhonchi, wheezes, rales.  Abdominal: Soft. BS +, no distension, tenderness, rebound or guarding. No CVA tenderness. Musculoskeletal: Normal range of motion. No edema and no tenderness.  Neuro: Alert. Normal reflexes, muscle tone coordination. No cranial nerve deficit. Skin: Skin is warm and dry. No rash noted. Not diaphoretic. No erythema. No pallor. Psychiatric: Normal mood and affect. Behavior, judgment, thought content normal.  Lab Results  Component Value Date   WBC 5.3 11/18/2013   HGB 14.6 11/18/2013   HCT 43.0 11/18/2013   MCV 89.7 11/18/2013   PLT 155 11/18/2013   Lab Results  Component Value Date   CREATININE 1.40* 11/18/2013   BUN 12 11/18/2013   NA 144 11/18/2013   K 3.9 11/18/2013   CL 106 11/18/2013   CO2 29 05/01/2013    Lab Results  Component Value Date   HGBA1C 5.5 04/11/2014   Lipid Panel  No results found for: CHOL, TRIG, HDL, CHOLHDL, VLDL, LDLCALC     Assessment and plan:   1. Frequent urination/2. Preventative health care  Results for orders placed or performed in visit on 04/11/14  Glucose (CBG)  Result Value Ref Range   POC Glucose 105 (A) 70 - 99 mg/dl  HgB [id number]  Result Value Ref Range   Hemoglobin A1C 5.5   Urinalysis Dipstick  Result Value Ref Range   Color, UA yellow    Clarity, UA clear    Glucose, UA neg    Bilirubin, UA neg    Ketones, UA neg    Spec Grav, UA 1.015    Blood, UA  moderate    pH, UA 5.5    Protein, UA neg    Urobilinogen, UA 0.2    Nitrite, UA neg    Leukocytes, UA Negative     - Urinalysis Dipstick is negative for infection, moderate blood , I have ordered CT scan to rule out any renal stones.   3. URI (upper respiratory infection)  Cough medication prn   4. Family history of prostate cancer We'll check PSA level.  - PSA  5. Hematuria  - CT RENAL STONE STUDY; Future  6. Screen for STD (sexually transmitted disease)  - HIV antibody (with reflex) - GC/chlamydia probe amp, urine  7. Screening  Ordered baseline blood work  - COMPLETE METABOLIC PANEL WITH GFR - CBC with Differential - TSH - Lipid panel - Vit D  25 hydroxy (rtn osteoporosis monitoring)  8. Essential hypertension D/c  HCTZ  started patient on norvasc , will come back in 2 weeks for BP check/nurse visit.  9. Renal insufficiency Will check blood chemistry       Health Maintenance Patient declines flu shot.  Return in about  3 months (around 07/12/2014) for hypertension, BP check in 2 weeks/Nurse Visit.   Doris CheadleADVANI, Ambriana Selway, MD

## 2014-04-12 LAB — LIPID PANEL
Cholesterol: 204 mg/dL — ABNORMAL HIGH (ref 0–200)
HDL: 43 mg/dL (ref >39)
LDL Cholesterol: 105 mg/dL — ABNORMAL HIGH (ref 0–99)
TRIGLYCERIDES: 279 mg/dL — AB (ref <150)
Total CHOL/HDL Ratio: 4.7 Ratio
VLDL: 56 mg/dL — ABNORMAL HIGH (ref 0–40)

## 2014-04-12 LAB — CBC WITH DIFFERENTIAL/PLATELET
BASOS PCT: 0 % (ref 0–1)
Basophils Absolute: 0 10*3/uL (ref 0.0–0.1)
EOS ABS: 0.1 10*3/uL (ref 0.0–0.7)
Eosinophils Relative: 1 % (ref 0–5)
HCT: 46.6 % (ref 39.0–52.0)
Hemoglobin: 15.3 g/dL (ref 13.0–17.0)
Lymphocytes Relative: 34 % (ref 12–46)
Lymphs Abs: 2.2 10*3/uL (ref 0.7–4.0)
MCH: 29.7 pg (ref 26.0–34.0)
MCHC: 32.8 g/dL (ref 30.0–36.0)
MCV: 90.3 fL (ref 78.0–100.0)
Monocytes Absolute: 0.5 10*3/uL (ref 0.1–1.0)
Monocytes Relative: 8 % (ref 3–12)
NEUTROS ABS: 3.6 10*3/uL (ref 1.7–7.7)
NEUTROS PCT: 57 % (ref 43–77)
Platelets: 208 10*3/uL (ref 150–400)
RBC: 5.16 MIL/uL (ref 4.22–5.81)
RDW: 13.6 % (ref 11.5–15.5)
WBC: 6.4 10*3/uL (ref 4.0–10.5)

## 2014-04-12 LAB — COMPLETE METABOLIC PANEL WITH GFR
ALBUMIN: 4.2 g/dL (ref 3.5–5.2)
ALK PHOS: 38 U/L — AB (ref 39–117)
ALT: 27 U/L (ref 0–53)
AST: 30 U/L (ref 0–37)
BUN: 12 mg/dL (ref 6–23)
CO2: 24 mEq/L (ref 19–32)
CREATININE: 1.47 mg/dL — AB (ref 0.50–1.35)
Calcium: 9.5 mg/dL (ref 8.4–10.5)
Chloride: 99 mEq/L (ref 96–112)
GFR, EST NON AFRICAN AMERICAN: 56 mL/min — AB
GFR, Est African American: 64 mL/min
Glucose, Bld: 84 mg/dL (ref 70–99)
Potassium: 4.5 mEq/L (ref 3.5–5.3)
SODIUM: 144 meq/L (ref 135–145)
Total Bilirubin: 0.3 mg/dL (ref 0.2–1.2)
Total Protein: 6.7 g/dL (ref 6.0–8.3)

## 2014-04-12 LAB — PSA: PSA: 1.62 ng/mL (ref <=4.00)

## 2014-04-12 LAB — HIV ANTIBODY (ROUTINE TESTING W REFLEX): HIV 1&2 Ab, 4th Generation: NONREACTIVE

## 2014-04-12 LAB — GC/CHLAMYDIA PROBE AMP, URINE
Chlamydia, Swab/Urine, PCR: NEGATIVE
GC PROBE AMP, URINE: NEGATIVE

## 2014-04-12 LAB — TSH: TSH: 5.641 u[IU]/mL — ABNORMAL HIGH (ref 0.350–4.500)

## 2014-04-14 LAB — VITAMIN D 25 HYDROXY (VIT D DEFICIENCY, FRACTURES): Vit D, 25-Hydroxy: 14 ng/mL — ABNORMAL LOW (ref 30–100)

## 2014-04-15 ENCOUNTER — Encounter (HOSPITAL_COMMUNITY): Payer: Self-pay

## 2014-04-15 ENCOUNTER — Ambulatory Visit (HOSPITAL_COMMUNITY)
Admission: RE | Admit: 2014-04-15 | Discharge: 2014-04-15 | Disposition: A | Discharge status: Home / Self Care | Payer: Self-pay | Source: Ambulatory Visit | Attending: Internal Medicine | Admitting: Internal Medicine

## 2014-04-15 DIAGNOSIS — R319 Hematuria, unspecified: Secondary | ICD-10-CM | POA: Insufficient documentation

## 2014-04-17 ENCOUNTER — Telehealth: Payer: Self-pay

## 2014-04-17 NOTE — Telephone Encounter (Signed)
-----   Message from Doris Cheadleeepak Advani, MD sent at 04/16/2014 12:33 PM EST ----- Call and let the patient know that his CT report was normal. IMPRESSION: No renal, ureteral, or bladder calculi. No hydronephrosis.  Negative CT abdomen/pelvis.

## 2014-04-17 NOTE — Telephone Encounter (Signed)
Tried to call patient to give results Patient not available Left message on voice  Mail  To return our call

## 2014-05-01 ENCOUNTER — Ambulatory Visit: Payer: Self-pay | Attending: Internal Medicine | Admitting: *Deleted

## 2014-05-01 ENCOUNTER — Telehealth: Payer: Self-pay | Admitting: Internal Medicine

## 2014-05-01 VITALS — BP 111/67 | HR 80 | Temp 98.5°F | Resp 20

## 2014-05-01 DIAGNOSIS — I10 Essential (primary) hypertension: Secondary | ICD-10-CM | POA: Insufficient documentation

## 2014-05-01 MED ORDER — VITAMIN D (ERGOCALCIFEROL) 1.25 MG (50000 UNIT) PO CAPS: 50000.0000 [IU] | ORAL_CAPSULE | ORAL | Status: AC

## 2014-05-01 NOTE — Progress Notes (Signed)
Reviewed results at today's nurse visit

## 2014-05-01 NOTE — Progress Notes (Signed)
Patient presents with fiance for BP check States taking all meds as directed Labs and CT results reviewed with patient Vit D e-scribed to [facility] on [address] Discussed need for low sodium diet. Patient states he has info on DASH Eating Plan and will begin lowering sodium consumption  BP 111/67 P 80 R 20 T 98.5 oral SPO2 97%  Patient advised to call for med refills at least 7 days before running out so as not to go without. Patient aware that he is to f/u with PCP 3 months from last visit

## 2014-05-07 ENCOUNTER — Ambulatory Visit: Payer: Self-pay | Attending: Internal Medicine

## 2014-05-26 ENCOUNTER — Ambulatory Visit: Payer: Self-pay | Attending: Internal Medicine

## 2014-07-08 ENCOUNTER — Encounter: Payer: Self-pay | Admitting: Internal Medicine

## 2014-07-08 ENCOUNTER — Ambulatory Visit: Payer: Self-pay | Attending: Internal Medicine | Admitting: Internal Medicine

## 2014-07-08 VITALS — BP 127/87 | HR 77 | Temp 98.0°F | Resp 16 | Wt 257.6 lb

## 2014-07-08 DIAGNOSIS — E559 Vitamin D deficiency, unspecified: Secondary | ICD-10-CM | POA: Insufficient documentation

## 2014-07-08 DIAGNOSIS — M25512 Pain in left shoulder: Secondary | ICD-10-CM | POA: Insufficient documentation

## 2014-07-08 DIAGNOSIS — R7989 Other specified abnormal findings of blood chemistry: Secondary | ICD-10-CM

## 2014-07-08 DIAGNOSIS — I10 Essential (primary) hypertension: Secondary | ICD-10-CM | POA: Insufficient documentation

## 2014-07-08 DIAGNOSIS — G8929 Other chronic pain: Secondary | ICD-10-CM

## 2014-07-08 DIAGNOSIS — M791 Myalgia, unspecified site: Secondary | ICD-10-CM

## 2014-07-08 DIAGNOSIS — M898X1 Other specified disorders of bone, shoulder: Secondary | ICD-10-CM

## 2014-07-08 DIAGNOSIS — R946 Abnormal results of thyroid function studies: Secondary | ICD-10-CM | POA: Insufficient documentation

## 2014-07-08 DIAGNOSIS — E785 Hyperlipidemia, unspecified: Secondary | ICD-10-CM | POA: Insufficient documentation

## 2014-07-08 NOTE — Patient Instructions (Signed)

## 2014-07-08 NOTE — Progress Notes (Signed)
MRN: [id number] Name: Donald French  Sex: male Age: 49 y.o. DOB: 1966-03-08  Allergies: Review of patient's allergies indicates no known allergies.  Chief Complaint  Patient presents with  . Shoulder Pain    HPI: Patient is 49 y.o. male who history of hypertension today complaining of upper back pain on the left scapular area denies any fall or trauma, patient has not tried any medication to help him with the symptoms, his previous blood work reviewed with the patient noticed vitamin D deficiency as per patient he has taken the prescription dosage of vitamin D, also noticed elevated cholesterol and abnormal TSH level.patient denies any family history of thyroid disease.  Past Medical History  Diagnosis Date  . Hypertension     Past Surgical History  Procedure Laterality Date  . Shoulder surgery        Medication List       This list is accurate as of: 07/08/14  3:12 PM.  Always use your most recent med list.               albuterol 108 (90 BASE) MCG/ACT inhaler  Commonly known as:  PROVENTIL HFA;VENTOLIN HFA  Inhale 2 puffs into the lungs every 4 (four) hours as needed for wheezing or shortness of breath.     amLODipine 2.5 MG tablet  Commonly known as:  NORVASC  Take 1 tablet (2.5 mg total) by mouth daily.     cetirizine 10 MG tablet  Commonly known as:  ZYRTEC  Take 10 mg by mouth daily.     chlorpheniramine-HYDROcodone 10-8 MG/5ML Lqcr  Commonly known as:  TUSSIONEX PENNKINETIC ER  Take 5 mLs by mouth at bedtime as needed for cough.     dextromethorphan 30 MG/5ML liquid  Commonly known as:  DELSYM  Take 60 mg by mouth 2 (two) times daily as needed for cough.     FISH OIL PO  Take 1 capsule by mouth 2 (two) times daily.     multivitamin with minerals Tabs tablet  Take 1 tablet by mouth daily.     Vitamin D (Ergocalciferol) 50000 UNITS Caps capsule  Commonly known as:  DRISDOL  Take 1 capsule (50,000 Units total) by mouth every 7 (seven) days.          No orders of the defined types were placed in this encounter.     There is no immunization history on file for this patient.  Family History  Problem Relation Age of Onset  . Hypertension Mother   . Hypertension Father   . Diabetes Father   . Cancer Paternal Grandmother     lung cancer    History  Substance Use Topics  . Smoking status: Never Smoker   . Smokeless tobacco: Not on file  . Alcohol Use: Yes    Review of Systems   As noted in HPI  Filed Vitals:   07/08/14 1440  BP: 127/87  Pulse: 77  Temp: 98 F (36.7 C)  Resp: 16    Physical Exam  Physical Exam  Constitutional: No distress.  Eyes: EOM are normal. Pupils are equal, round, and reactive to light.  Cardiovascular: Normal rate and regular rhythm.   Pulmonary/Chest: Breath sounds normal. No respiratory distress. He has no wheezes. He has no rales.  Musculoskeletal:  Left shoulder Good ROM, left scapular area tenderness with palpation  no swelling or erythema     CBC    Component Value Date/Time   WBC 6.4 04/11/2014 1709  RBC 5.16 04/11/2014 1709   HGB 15.3 04/11/2014 1709   HCT 46.6 04/11/2014 1709   PLT 208 04/11/2014 1709   MCV 90.3 04/11/2014 1709   LYMPHSABS 2.2 04/11/2014 1709   MONOABS 0.5 04/11/2014 1709   EOSABS 0.1 04/11/2014 1709   BASOSABS 0.0 04/11/2014 1709    CMP     Component Value Date/Time   NA 144 04/11/2014 1709   K 4.5 04/11/2014 1709   CL 99 04/11/2014 1709   CO2 24 04/11/2014 1709   GLUCOSE 84 04/11/2014 1709   BUN 12 04/11/2014 1709   CREATININE 1.47* 04/11/2014 1709   CREATININE 1.40* 11/18/2013 0207   CALCIUM 9.5 04/11/2014 1709   PROT 6.7 04/11/2014 1709   ALBUMIN 4.2 04/11/2014 1709   AST 30 04/11/2014 1709   ALT 27 04/11/2014 1709   ALKPHOS 38* 04/11/2014 1709   BILITOT 0.3 04/11/2014 1709   GFRNONAA 56* 04/11/2014 1709   GFRNONAA 61* 05/01/2013 0040   GFRAA 64 04/11/2014 1709   GFRAA 71* 05/01/2013 0040    Lab Results  Component Value  Date/Time   CHOL 204* 04/11/2014 05:09 PM    No components found for: HGA1C  Lab Results  Component Value Date/Time   AST 30 04/11/2014 05:09 PM    Assessment and Plan  Muscle pain - Plan:likely muscular pain patient will tryibuprofen/Aleve when necessary, advise patient to apply heating pad.  Chronic scapular pain - Plan: DG Scapula Left  Essential hypertension Blood pressure is well-controlled continued current meds.  Abnormal TSH - Plan:I have ordered TFT panel  TSH, T4, free, T3, free  Hyperlipidemia Advised patient for low fat diet.  Vitamin D deficiency Patient will start taking vitamin D 2000 units every day.  Health Maintenance  -Vaccinations:  -patient declines flu shot   Return in about 3 months (around 10/06/2014), or if symptoms worsen or fail to improve, for hypertension.  Doris CheadleADVANI, Maybel Dambrosio, MD

## 2014-07-08 NOTE — Progress Notes (Signed)
Patient complains of having pain to his left lower shoulder blade for about two weeks Patient states he had been exercising and possibly pulled a muscle

## 2014-07-09 ENCOUNTER — Telehealth: Payer: Self-pay | Admitting: *Deleted

## 2014-07-09 LAB — T4, FREE: Free T4: 1.02 ng/dL (ref 0.80–1.80)

## 2014-07-09 LAB — T3, FREE: T3, Free: 2.8 pg/mL (ref 2.3–4.2)

## 2014-07-09 LAB — TSH: TSH: 2.641 u[IU]/mL (ref 0.350–4.500)

## 2014-07-09 NOTE — Telephone Encounter (Signed)
Left message with Donald HarmanDana, normal labs

## 2014-07-09 NOTE — Telephone Encounter (Signed)
-----   Message from Doris Cheadleeepak Advani, MD sent at 07/09/2014  9:14 AM EST ----- Call and let the patient know that his TFT panel is normal

## 2014-07-10 ENCOUNTER — Telehealth: Payer: Self-pay | Admitting: *Deleted

## 2014-07-10 NOTE — Telephone Encounter (Signed)
LVM to return call.

## 2014-08-25 ENCOUNTER — Emergency Department (HOSPITAL_COMMUNITY): Payer: Self-pay

## 2014-08-25 ENCOUNTER — Encounter (HOSPITAL_COMMUNITY): Payer: Self-pay

## 2014-08-25 ENCOUNTER — Emergency Department (HOSPITAL_COMMUNITY)
Admission: EM | Admit: 2014-08-25 | Discharge: 2014-08-25 | Disposition: A | Discharge status: Home / Self Care | Payer: Self-pay | Attending: Emergency Medicine | Admitting: Emergency Medicine

## 2014-08-25 DIAGNOSIS — I10 Essential (primary) hypertension: Secondary | ICD-10-CM | POA: Insufficient documentation

## 2014-08-25 DIAGNOSIS — M79609 Pain in unspecified limb: Secondary | ICD-10-CM

## 2014-08-25 DIAGNOSIS — Z79899 Other long term (current) drug therapy: Secondary | ICD-10-CM | POA: Insufficient documentation

## 2014-08-25 DIAGNOSIS — I82402 Acute embolism and thrombosis of unspecified deep veins of left lower extremity: Secondary | ICD-10-CM | POA: Insufficient documentation

## 2014-08-25 DIAGNOSIS — M1712 Unilateral primary osteoarthritis, left knee: Secondary | ICD-10-CM | POA: Insufficient documentation

## 2014-08-25 LAB — COMPREHENSIVE METABOLIC PANEL
ALT: 22 U/L (ref 0–53)
AST: 21 U/L (ref 0–37)
Albumin: 3.5 g/dL (ref 3.5–5.2)
Alkaline Phosphatase: 44 U/L (ref 39–117)
Anion gap: 8 (ref 5–15)
BUN: 7 mg/dL (ref 6–23)
CHLORIDE: 107 mmol/L (ref 96–112)
CO2: 24 mmol/L (ref 19–32)
Calcium: 8.9 mg/dL (ref 8.4–10.5)
Creatinine, Ser: 1.29 mg/dL (ref 0.50–1.35)
GFR calc Af Amer: 74 mL/min — ABNORMAL LOW (ref >90)
GFR, EST NON AFRICAN AMERICAN: 64 mL/min — AB (ref >90)
GLUCOSE: 101 mg/dL — AB (ref 70–99)
POTASSIUM: 4.6 mmol/L (ref 3.5–5.1)
SODIUM: 139 mmol/L (ref 135–145)
Total Bilirubin: 0.7 mg/dL (ref 0.3–1.2)
Total Protein: 6.1 g/dL (ref 6.0–8.3)

## 2014-08-25 LAB — PROTIME-INR
INR: 1.04 (ref 0.00–1.49)
Prothrombin Time: 13.7 seconds (ref 11.6–15.2)

## 2014-08-25 LAB — CBC
HCT: 47.2 % (ref 39.0–52.0)
Hemoglobin: 15.6 g/dL (ref 13.0–17.0)
MCH: 30.1 pg (ref 26.0–34.0)
MCHC: 33.1 g/dL (ref 30.0–36.0)
MCV: 90.9 fL (ref 78.0–100.0)
PLATELETS: 141 10*3/uL — AB (ref 150–400)
RBC: 5.19 MIL/uL (ref 4.22–5.81)
RDW: 12.9 % (ref 11.5–15.5)
WBC: 6.1 10*3/uL (ref 4.0–10.5)

## 2014-08-25 LAB — APTT: aPTT: 28 seconds (ref 24–37)

## 2014-08-25 MED ORDER — ACETAMINOPHEN 325 MG PO TABS
650.0000 mg | ORAL_TABLET | Freq: Once | ORAL | Status: AC
  Administered 2014-08-25: 650 mg via ORAL
  Filled 2014-08-25: qty 2

## 2014-08-25 MED ORDER — RIVAROXABAN (XARELTO) EDUCATION KIT FOR DVT/PE PATIENTS
PACK | Freq: Once | Status: AC
  Administered 2014-08-25: 14:00:00
  Filled 2014-08-25: qty 1

## 2014-08-25 MED ORDER — XARELTO VTE STARTER PACK 15 & 20 MG PO TBPK: 15.0000 mg | ORAL_TABLET | ORAL | Status: DC

## 2014-08-25 MED ORDER — RIVAROXABAN 15 MG PO TABS
15.0000 mg | ORAL_TABLET | Freq: Two times a day (BID) | ORAL | Status: DC
  Administered 2014-08-25: 15 mg via ORAL
  Filled 2014-08-25 (×2): qty 1

## 2014-08-25 NOTE — Progress Notes (Signed)
Donald French J. Lucretia RoersWood, RN, BSN, [employer] [phone] Spoke with pt at bedside regarding Xarelto prescription.  Gave pt Xarelto brochure with 30 day free card and refill assistance card intact.  [facility]  for [facility].  NCM called pharmacy to confirm availability of medication.  Information relayed to pt.  Pt verbalizes importance of filling medication upon discharge.

## 2014-08-25 NOTE — ED Notes (Signed)
MD at bedside. 

## 2014-08-25 NOTE — ED Provider Notes (Signed)
CSN: [id number]     Arrival date & time 08/25/14  0932 History   First MD Initiated Contact with Patient 08/25/14 [phone]     Chief Complaint  Patient presents with  . Back Pain  . Leg Pain     (Consider location/radiation/quality/duration/timing/severity/associated sxs/prior Treatment) HPI  Donald French is a 49 y.o. male with PMH of HTN presenting with a week of achy/ soreness behind left knee with swelling and erythema developments in the last 1-2 days as well as right calf pain. No swelling or erythema to right calf. Patient has not taken anything for his symptoms. Pt denies history of DVT, PE, recent surgery or trauma, malignancy, hemoptysis,  unilateral leg swelling, immobilization. Patient also recently seen for left scapular pain is worse with movement and his primary has ordered a scapula film. He has not been using recommended rice therapy or ibuprofen.    Past Medical History  Diagnosis Date  . Hypertension    Past Surgical History  Procedure Laterality Date  . Shoulder surgery     Family History  Problem Relation Age of Onset  . Hypertension Mother   . Hypertension Father   . Diabetes Father   . Cancer Paternal Grandmother     lung cancer   History  Substance Use Topics  . Smoking status: Never Smoker   . Smokeless tobacco: Not on file  . Alcohol Use: Yes    Review of Systems  Musculoskeletal: Positive for myalgias. Negative for joint swelling.  Skin: Positive for color change. Negative for wound.  Neurological: Negative for weakness and numbness.  All other systems reviewed and are negative.     Allergies  Review of patient's allergies indicates no known allergies.  Home Medications   Prior to Admission medications   Medication Sig Start Date End Date Taking? Authorizing Provider  amLODipine (NORVASC) 2.5 MG tablet Take 1 tablet (2.5 mg total) by mouth daily. 04/11/14  Yes Doris Cheadle, MD  Multiple Vitamin (MULTIVITAMIN WITH MINERALS) TABS tablet  Take 1 tablet by mouth daily.   Yes Historical Provider, MD  albuterol (PROVENTIL HFA;VENTOLIN HFA) 108 (90 BASE) MCG/ACT inhaler Inhale 2 puffs into the lungs every 4 (four) hours as needed for wheezing or shortness of breath. Patient not taking: Reported on 05/01/2014 04/10/14   Oswaldo Conroy, PA-C  chlorpheniramine-HYDROcodone [facility]) 10-8 MG/5ML LQCR Take 5 mLs by mouth at bedtime as needed for cough. Patient not taking: Reported on 05/01/2014 04/10/14   Oswaldo Conroy, PA-C  XARELTO STARTER PACK 15 & 20 MG TBPK Take 15-20 mg by mouth as directed. Take as directed on package: Start with one  tablet by mouth twice a day with food. On Day 22, switch to one  tablet once a day with food. 08/25/14   Oswaldo Conroy, PA-C   BP 123/84 mmHg  Pulse 71  Temp(Src) 97.9 F (36.6 C) (Oral)  Resp 18  Ht  (1.93 m)  Wt 248 lb (112.492 kg)  BMI 30.20 kg/m2  SpO2 99% Physical Exam  Constitutional: He appears well-developed and well-nourished. No distress.  HENT:  Head: Normocephalic and atraumatic.  Eyes: Conjunctivae and EOM are normal. Right eye exhibits no discharge. Left eye exhibits no discharge.  Cardiovascular: Normal rate and regular rhythm.   L calf tenderness with firm but not hard compartment. No erythema. 2+ pedal pulses equal bilaterally. No palpable cord.  Pulmonary/Chest: Effort normal and breath sounds normal. No respiratory distress. He has no wheezes.  Abdominal: Soft. Bowel sounds  are normal. He exhibits no distension. There is no tenderness.  Musculoskeletal:  Left knee: No effusion, erythema or warmth. No tenderness over medial or lateral joint line. No ligamentous laxity noted. Negative anterior and posterior drawer test. Patient with normal gait. No tenderness to lower leg or thigh. No lesions, ecchymoses or wounds to thigh or leg. Erythema and mild swelling to posterior left knee. Left lower scapular tenderness without any skin changes.   Neurological: He is alert. He exhibits normal muscle tone. Coordination normal.  Skin: Skin is warm and dry. He is not diaphoretic.  Nursing note and vitals reviewed.   ED Course  Procedures (including critical care time) Labs Review Labs Reviewed  CBC - Abnormal; Notable for the following:    Platelets 141 (*)    All other components within normal limits  COMPREHENSIVE METABOLIC PANEL - Abnormal; Notable for the following:    Glucose, Bld 101 (*)    GFR calc non Af Amer 64 (*)    GFR calc Af Amer 74 (*)    All other components within normal limits  PROTIME-INR  APTT    Imaging Review Dg Knee Complete 4 Views Left  08/25/2014   CLINICAL DATA:  Pain in the back of the knee.  No known injury.  EXAM: LEFT KNEE - COMPLETE 4+ VIEW  COMPARISON:  None.  FINDINGS: There is no evidence of fracture, dislocation, or joint effusion. Small patellofemoral compartment marginal osteophytes. No lytic or sclerotic osseous lesion. Soft tissues are unremarkable.  IMPRESSION: No acute osseous injury of the left knee.   Electronically Signed   By: Elige KoHetal  Patel   On: 08/25/2014 11:02     EKG Interpretation None      MDM   Final diagnoses:  DVT (deep venous thrombosis), left  Primary osteoarthritis of left knee   Knee neurovascularly intact. X-ray without acute osseous injury with evidence of osteoarthritis of the patellofemoral compartment. Patient low risk for DVT and PE. No chest pain or shortness of breath. Duplex ordered. Venous duplex with evidence of DVT. Screening labs and coagulation studies ordered. Patient with decreased GFR but no other abnormalities. Patient given first dose of Xarelto as well as starter pack and follow-up with wellness center. Patient was seen by care management to help facilitate financial assistance with xarelto. Patient to follow-up with PCP for further workup for his shoulder pain. Discussed Rice and Tylenol use.  Discussed return precautions with patient.  Discussed all results and patient verbalizes understanding and agrees with plan.  Case has been discussed with Dr. Bebe ShaggyWickline who agrees with the above plan and to discharge.       Oswaldo ConroyVictoria Halil Rentz, PA-C 08/25/14 1344  [address]Victoria Taja Pentland, New JerseyPA-C 08/25/14 1600  Zadie Rhineonald Wickline, MD 08/25/14 [id number]

## 2014-08-25 NOTE — Progress Notes (Signed)
ANTICOAGULATION CONSULT NOTE - Initial Consult  Pharmacy Consult for xarelto Indication: DVT  No Known Allergies  Patient Measurements: Height: _0  (193 cm) Weight: 248 lb (112.492 kg) IBW/kg (Calculated) : 86.8   Vital Signs: Temp: 97.9 F (36.6 C) (03/28 0942) Temp Source: Oral (03/28 0942) BP: 119/86 mmHg (03/28 1300) Pulse Rate: 65 (03/28 1300)  Labs:  Recent Labs  08/25/14 1135  HGB 15.6  HCT 47.2  PLT 141*  APTT 28  LABPROT 13.7  INR 1.04  CREATININE 1.29    Estimated Creatinine Clearance: 96.2 mL/min (by C-G formula based on Cr of 1.29).   Medical History: Past Medical History  Diagnosis Date  . Hypertension    Assessment: 49 yo M with new  DVT.  Pharmacy consulted to dose xarelto. 112 kg,  Creat 1.29, H/H 15.6/47.2, pltc 141.   Plan:  -xarelto 15 mg po bid with meals x 21 days then xarelto 20 mg qsupper. -xarelto teaching kit given to pt with 30 day free card -pt and family member educated about dosing schedule and s/sx of bleeding with all questions answered -please write dc script for xarelto starter pack Eudelia Bunch, Pharm.D. [phone] 08/25/2014 1:27 PM

## 2014-08-25 NOTE — Progress Notes (Signed)
*  PRELIMINARY RESULTS* Vascular Ultrasound Lower extremity venous duplex has been completed.  Preliminary findings: Positive for DVT involving the Left distal Femoral, Popliteal, Gastroc, Posterior tibial, and Peroneal veins. Superficial thrombosis noted in the left lesser saphenous vein. No DVT RLE.    Farrel DemarkJill Eunice, RDMS, RVT  08/25/2014, 11:19 AM

## 2014-08-25 NOTE — Discharge Instructions (Addendum)
Return to the emergency room with worsening of symptoms, new symptoms or with symptoms that are concerning , especially shortness of breath, chest pain that feels like a pressure or sharp, spreads to left arm or jaw, worse with exertion, associated with nausea, vomiting, shortness of breath and/or sweating.  Please call your doctor for a followup appointment within 24-48 hours. When you talk to your doctor please let them know that you were seen in the emergency department and have them acquire all of your records so that they can discuss the findings with you and formulate a treatment plan to fully care for your new and ongoing problems.  Take xarelto as prescribed. Read below information and follow recommendations.  Deep Vein Thrombosis A deep vein thrombosis (DVT) is a blood clot that develops in the deep, larger veins of the leg, arm, or pelvis. These are more dangerous than clots that might form in veins near the surface of the body. A DVT can lead to serious and even life-threatening complications if the clot breaks off and travels in the bloodstream to the lungs.  A DVT can damage the valves in your leg veins so that instead of flowing upward, the blood pools in the lower leg. This is called post-thrombotic syndrome, and it can result in pain, swelling, discoloration, and sores on the leg. CAUSES Usually, several things contribute to the formation of blood clots. Contributing factors include:  The flow of blood slows down.  The inside of the vein is damaged in some way.  You have a condition that makes blood clot more easily. RISK FACTORS Some people are more likely than others to develop blood clots. Risk factors include:   Smoking.  Being overweight (obese).  Sitting or lying still for a long time. This includes long-distance travel, paralysis, or recovery from an illness or surgery. Other factors that increase risk are:   Older age, especially over 54 years of age.  Having a  family history of blood clots or if you have already had a blot clot.  Having major or lengthy surgery. This is especially true for surgery on the hip, knee, or belly (abdomen). Hip surgery is particularly high risk.  Having a long, thin tube (catheter) placed inside a vein during a medical procedure.  Breaking a hip or leg.  Having cancer or cancer treatment.  Pregnancy and childbirth.  Hormone changes make the blood clot more easily during pregnancy.  The fetus puts pressure on the veins of the pelvis.  There is a risk of injury to veins during delivery or a caesarean delivery. The risk is highest just after childbirth.  Medicines containing the male hormone estrogen. This includes birth control pills and hormone replacement therapy.  Other circulation or heart problems.  SIGNS AND SYMPTOMS When a clot forms, it can either partially or totally block the blood flow in that vein. Symptoms of a DVT can include:  Swelling of the leg or arm, especially if one side is much worse.  Warmth and redness of the leg or arm, especially if one side is much worse.  Pain in an arm or leg. If the clot is in the leg, symptoms may be more noticeable or worse when standing or walking. The symptoms of a DVT that has traveled to the lungs (pulmonary embolism, PE) usually start suddenly and include:  Shortness of breath.  Coughing.  Coughing up blood or blood-tinged mucus.  Chest pain. The chest pain is often worse with deep breaths.  Rapid  heartbeat. Anyone with these symptoms should get emergency medical treatment right away. Do not wait to see if the symptoms will go away. Call your local emergency services (911 in the [place] if you have these symptoms. Do not drive yourself to the hospital. DIAGNOSIS If a DVT is suspected, your health care provider will take a full medical history and perform a physical exam. Tests that also may be required include:  Blood tests, including studies of the  clotting properties of the blood.  Ultrasound to see if you have clots in your legs or lungs.  X-rays to show the flow of blood when dye is injected into the veins (venogram).  Studies of your lungs if you have any chest symptoms. PREVENTION  Exercise the legs regularly. Take a brisk 30-minute walk every day.  Maintain a weight that is appropriate for your height.  Avoid sitting or lying in bed for long periods of time without moving your legs.  Women, particularly those over the age of 35 years, should consider the risks and benefits of taking estrogen medicines, including birth control pills.  Do not smoke, especially if you take estrogen medicines.  Long-distance travel can increase your risk of DVT. You should exercise your legs by walking or pumping the muscles every hour.  Many of the risk factors above relate to situations that exist with hospitalization, either for illness, injury, or elective surgery. Prevention may include medical and nonmedical measures.  Your health care provider will assess you for the need for venous thromboembolism prevention when you are admitted to the hospital. If you are having surgery, your surgeon will assess you the day of or day after surgery. TREATMENT Once identified, a DVT can be treated. It can also be prevented in some circumstances. Once you have had a DVT, you may be at increased risk for a DVT in the future. The most common treatment for DVT is blood-thinning (anticoagulant) medicine, which reduces the blood's tendency to clot. Anticoagulants can stop new blood clots from forming and stop old clots from growing. They cannot dissolve existing clots. Your body does this by itself over time. Anticoagulants can be given by mouth, through an IV tube, or by injection. Your health care provider will determine the best program for you. Other medicines or treatments that may be used are:  Heparin or related medicines (low molecular weight heparin) are  often the first treatment for a blood clot. They act quickly. However, they cannot be taken orally and must be given either in shot form or by IV tube.  Heparin can cause a fall in a component of blood that stops bleeding and forms blood clots (platelets). You will be monitored with blood tests to be sure this does not occur.  Warfarin is an anticoagulant that can be swallowed. It takes a few days to start working, so usually heparin or related medicines are used in combination. Once warfarin is working, heparin is usually stopped.  Factor Xa inhibitor medicines, such as rivaroxaban and apixaban, also reduce blood clotting. These medicines are taken orally and can often be used without heparin or related medicines.  Less commonly, clot dissolving drugs (thrombolytics) are used to dissolve a DVT. They carry a high risk of bleeding, so they are used mainly in severe cases where your life or a part of your body is threatened.  Very rarely, a blood clot in the leg needs to be removed surgically.  If you are unable to take anticoagulants, your health care provider  may arrange for you to have a filter placed in a main vein in your abdomen. This filter prevents clots from traveling to your lungs. HOME CARE INSTRUCTIONS  Take all medicines as directed by your health care provider.  Learn as much as you can about DVT.  Wear a medical alert bracelet or carry a medical alert card.  Ask your health care provider how soon you can go back to normal activities. It is important to stay active to prevent blood clots. If you are on anticoagulant medicine, avoid contact sports.  It is very important to exercise. This is especially important while traveling, sitting, or standing for long periods of time. Exercise your legs by walking or by tightening and relaxing your leg muscles regularly. Take frequent walks.  You may need to wear compression stockings. These are tight elastic stockings that apply pressure to  the lower legs. This pressure can help keep the blood in the legs from clotting. Taking Warfarin Warfarin is a daily medicine that is taken by mouth. Your health care provider will advise you on the length of treatment (usually 3-6 months, sometimes lifelong). If you take warfarin:  Understand how to take warfarin and foods that can affect how warfarin works in Public relations account executive.  Too much and too little warfarin are both dangerous. Too much warfarin increases the risk of bleeding. Too little warfarin continues to allow the risk for blood clots. Warfarin and Regular Blood Testing While taking warfarin, you will need to have regular blood tests to measure your blood clotting time. These blood tests usually include both the prothrombin time (PT) and international normalized ratio (INR) tests. The PT and INR results allow your health care provider to adjust your dose of warfarin. It is very important that you have your PT and INR tested as often as directed by your health care provider.  Warfarin and Your Diet Avoid major changes in your diet, or notify your health care provider before changing your diet. Arrange a visit with a registered dietitian to answer your questions. Many foods, especially foods high in vitamin K, can interfere with warfarin and affect the PT and INR results. You should eat a consistent amount of foods high in vitamin K. Foods high in vitamin K include:   Spinach, kale, broccoli, cabbage, collard and turnip greens, Brussels sprouts, peas, cauliflower, seaweed, and parsley.  Beef and pork liver.  Green tea.  Soybean oil. Warfarin with Other Medicines Many medicines can interfere with warfarin and affect the PT and INR results. You must:  Tell your health care provider about any and all medicines, vitamins, and supplements you take, including aspirin and other over-the-counter anti-inflammatory medicines. Be especially cautious with aspirin and anti-inflammatory medicines. Ask your  health care provider before taking these.  Do not take or discontinue any prescribed or over-the-counter medicine except on the advice of your health care provider or pharmacist. Warfarin Side Effects Warfarin can have side effects, such as easy bruising and difficulty stopping bleeding. Ask your health care provider or pharmacist about other side effects of warfarin. You will need to:  Hold pressure over cuts for longer than usual.  Notify your dentist and other health care providers that you are taking warfarin before you undergo any procedures where bleeding may occur. Warfarin with Alcohol and Tobacco   Drinking alcohol frequently can increase the effect of warfarin, leading to excess bleeding. It is best to avoid alcoholic drinks or to consume only very small amounts while taking warfarin. Notify  your health care provider if you change your alcohol intake.   Do not use any tobacco products including cigarettes, chewing tobacco, or electronic cigarettes. If you smoke, quit. Ask your health care provider for help with quitting smoking. Alternative Medicines to Warfarin: Factor Xa Inhibitor Medicines  These blood-thinning medicines are taken by mouth, usually for several weeks or longer. It is important to take the medicine every single day at the same time each day.  There are no regular blood tests required when using these medicines.  There are fewer food and drug interactions than with warfarin.  The side effects of this class of medicine are similar to those of warfarin, including excessive bruising or bleeding. Ask your health care provider or pharmacist about other potential side effects. SEEK MEDICAL CARE IF:  You notice a rapid heartbeat.  You feel weaker or more tired than usual.  You feel faint.  You notice increased bruising.  You feel your symptoms are not getting better in the time expected.  You believe you are having side effects of medicine. SEEK IMMEDIATE  MEDICAL CARE IF:  You have chest pain.  You have trouble breathing.  You have new or increased swelling or pain in one leg.  You cough up blood.  You notice blood in vomit, in a bowel movement, or in urine. MAKE SURE YOU:  Understand these instructions.  Will watch your condition.  Will get help right away if you are not doing well or get worse. Document Released: 05/16/2005 Document Revised: 09/30/2013 Document Reviewed: 01/21/2013 [facility] Patient Information 2015 [employer] [place]. This information is not intended to replace advice given to you by your health care provider. Make sure you discuss any questions you have with your health care provider.    Information on my medicine - XARELTO (rivaroxaban)  This medication education was reviewed with me or my healthcare representative as part of my discharge preparation.  The pharmacist that spoke with me during my hospital stay was:  Herby AbrahamBell, Michelle T, [facility] Campus[facility]PH  WHY WAS XARELTO PRESCRIBED FOR YOU? Xarelto was prescribed to treat blood clots that may have been found in the veins of your legs (deep vein thrombosis) or in your lungs (pulmonary embolism) and to reduce the risk of them occurring again.  What do you need to know about Xarelto? The starting dose is one 15 mg tablet taken TWICE daily with food for the FIRST 21 DAYS then on Tuesday April 19th  the dose is changed to one 20 mg tablet taken ONCE A DAY with your evening meal.  DO NOT stop taking Xarelto without talking to the health care provider who prescribed the medication.  Refill your prescription for 20 mg tablets before you run out.  After discharge, you should have regular check-up appointments with your healthcare provider that is prescribing your Xarelto.  In the future your dose may need to be changed if your kidney function changes by a significant amount.  What do you do if you miss a dose? If you are taking Xarelto TWICE DAILY and you miss a dose, take it as  soon as you remember. You may take two 15 mg tablets (total 30 mg) at the same time then resume your regularly scheduled 15 mg twice daily the next day.  If you are taking Xarelto ONCE DAILY and you miss a dose, take it as soon as you remember on the same day then continue your regularly scheduled once daily regimen the next day. Do not take two  doses of Xarelto at the same time.   Important Safety Information Xarelto is a blood thinner medicine that can cause bleeding. You should call your healthcare provider right away if you experience any of the following: ? Bleeding from an injury or your nose that does not stop. ? Unusual colored urine (red or dark brown) or unusual colored stools (red or black). ? Unusual bruising for unknown reasons. ? A serious fall or if you hit your head (even if there is no bleeding).  Some medicines may interact with Xarelto and might increase your risk of bleeding while on Xarelto. To help avoid this, consult your healthcare provider or pharmacist prior to using any new prescription or non-prescription medications, including herbals, vitamins, non-steroidal anti-inflammatory drugs (NSAIDs) and supplements.  This website has more information on Xarelto: VisitDestination.com.br.

## 2014-08-25 NOTE — ED Notes (Signed)
Pt here for back pain for three weeks and now reports left knee pain with redness and swelling noted to area right behind knee. Denies injury

## 2014-08-27 ENCOUNTER — Ambulatory Visit: Payer: Self-pay | Attending: Internal Medicine | Admitting: Internal Medicine

## 2014-08-27 ENCOUNTER — Encounter: Payer: Self-pay | Admitting: Internal Medicine

## 2014-08-27 VITALS — BP 128/89 | HR 71 | Temp 98.8°F | Resp 16 | Wt 251.4 lb

## 2014-08-27 DIAGNOSIS — I10 Essential (primary) hypertension: Secondary | ICD-10-CM | POA: Insufficient documentation

## 2014-08-27 DIAGNOSIS — G8929 Other chronic pain: Secondary | ICD-10-CM | POA: Insufficient documentation

## 2014-08-27 DIAGNOSIS — M25519 Pain in unspecified shoulder: Secondary | ICD-10-CM | POA: Insufficient documentation

## 2014-08-27 DIAGNOSIS — Z7901 Long term (current) use of anticoagulants: Secondary | ICD-10-CM | POA: Insufficient documentation

## 2014-08-27 DIAGNOSIS — I82402 Acute embolism and thrombosis of unspecified deep veins of left lower extremity: Secondary | ICD-10-CM | POA: Insufficient documentation

## 2014-08-27 DIAGNOSIS — M898X1 Other specified disorders of bone, shoulder: Secondary | ICD-10-CM

## 2014-08-27 MED ORDER — RIVAROXABAN 20 MG PO TABS: 20.0000 mg | ORAL_TABLET | Freq: Every day | ORAL | Status: DC

## 2014-08-27 NOTE — Progress Notes (Signed)
Patient here for follow up Patient states still having back pain but did not get the x ray done Requesting blood work to check his prostate and regular blood counts Currently on xarelto for a blood clot in his leg

## 2014-08-27 NOTE — Progress Notes (Signed)
MRN: [id number] Name: Donald French  Sex: male Age: 49 y.o. DOB: 11/17/1965  Allergies: Review of patient's allergies indicates no known allergies.  Chief Complaint  Patient presents with  . Follow-up    HPI: Patient is 49 y.o. male who history of hypertension, recently went to the emergency room with the symptoms of left leg pain/swelling, EMR reviewed patient had ultrasound done diagnosed with a daily PT and has been started on Xarelto patient denies any family history of DVTs, as per patient he was told that he should continue medication at least for 4 months,patient also has still some upper back pain, on the last visit x-ray was ordered as per patient he will go and get the x-ray done.  Past Medical History  Diagnosis Date  . Hypertension     Past Surgical History  Procedure Laterality Date  . Shoulder surgery        Medication List       This list is accurate as of: 08/27/14 10:29 AM.  Always use your most recent med list.               albuterol 108 (90 BASE) MCG/ACT inhaler  Commonly known as:  PROVENTIL HFA;VENTOLIN HFA  Inhale 2 puffs into the lungs every 4 (four) hours as needed for wheezing or shortness of breath.     amLODipine 2.5 MG tablet  Commonly known as:  NORVASC  Take 1 tablet (2.5 mg total) by mouth daily.     chlorpheniramine-HYDROcodone 10-8 MG/5ML Lqcr  Commonly known as:  TUSSIONEX PENNKINETIC ER  Take 5 mLs by mouth at bedtime as needed for cough.     multivitamin with minerals Tabs tablet  Take 1 tablet by mouth daily.     XARELTO STARTER PACK 15 & 20 MG Tbpk  Generic drug:  Rivaroxaban  Take 15-20 mg by mouth as directed. Take as directed on package: Start with one  tablet by mouth twice a day with food. On Day 22, switch to one  tablet once a day with food.     rivaroxaban 20 MG Tabs tablet  Commonly known as:  XARELTO  Take 1 tablet (20 mg total) by mouth daily with supper.        Meds ordered this encounter    Medications  . rivaroxaban (XARELTO) 20 MG TABS tablet    Sig: Take 1 tablet (20 mg total) by mouth daily with supper.    Dispense:  30 tablet    Refill:  3     There is no immunization history on file for this patient.  Family History  Problem Relation Age of Onset  . Hypertension Mother   . Hypertension Father   . Diabetes Father   . Cancer Paternal Grandmother     lung cancer    History  Substance Use Topics  . Smoking status: Never Smoker   . Smokeless tobacco: Not on file  . Alcohol Use: Yes    Review of Systems   As noted in HPI  Filed Vitals:   08/27/14 0943  BP: 128/89  Pulse: 71  Temp: 98.8 F (37.1 C)  Resp: 16    Physical Exam  Physical Exam  Eyes: EOM are normal. Pupils are equal, round, and reactive to light.  Cardiovascular: Normal rate and regular rhythm.   Pulmonary/Chest: Breath sounds normal. No respiratory distress. He has no wheezes. He has no rales.  Musculoskeletal:  Minimal swelling in the left leg, no apparent erythema, minimal  tenderness    CBC    Component Value Date/Time   WBC 6.1 08/25/2014 1135   RBC 5.19 08/25/2014 1135   HGB 15.6 08/25/2014 1135   HCT 47.2 08/25/2014 1135   PLT 141* 08/25/2014 1135   MCV 90.9 08/25/2014 1135   LYMPHSABS 2.2 04/11/2014 1709   MONOABS 0.5 04/11/2014 1709   EOSABS 0.1 04/11/2014 1709   BASOSABS 0.0 04/11/2014 1709    CMP     Component Value Date/Time   NA 139 08/25/2014 1135   K 4.6 08/25/2014 1135   CL 107 08/25/2014 1135   CO2 24 08/25/2014 1135   GLUCOSE 101* 08/25/2014 1135   BUN 7 08/25/2014 1135   CREATININE 1.29 08/25/2014 1135   CREATININE 1.47* 04/11/2014 1709   CALCIUM 8.9 08/25/2014 1135   PROT 6.1 08/25/2014 1135   ALBUMIN 3.5 08/25/2014 1135   AST 21 08/25/2014 1135   ALT 22 08/25/2014 1135   ALKPHOS 44 08/25/2014 1135   BILITOT 0.7 08/25/2014 1135   GFRNONAA 64* 08/25/2014 1135   GFRNONAA 56* 04/11/2014 1709   GFRAA 74* 08/25/2014 1135   GFRAA 64  04/11/2014 1709    Lab Results  Component Value Date/Time   CHOL 204* 04/11/2014 05:09 PM    No components found for: HGA1C  Lab Results  Component Value Date/Time   AST 21 08/25/2014 11:35 AM    Assessment and Plan  Leg DVT (deep venous thromboembolism), acute, left - Plan: patient has started taking Xarelto, continue the treatment for 3-6 months. rivaroxaban (XARELTO) 20 MG TABS tablet  Essential hypertension Blood pressure is well controlled, continue with current meds  Chronic scapular pain Have advised patient to take Tylenol when necessary and apply heating pad, avoid any NSAIDs since he is on Xarelto, patient will get an x-ray done   Return in about 3 months (around 11/27/2014) for hypertension.   This note has been created with Education officer, environmentalDragon speech recognition software and smart phrase technology. Any transcriptional errors are unintentional.    Doris CheadleADVANI, Gideon Burstein, MD

## 2014-08-28 ENCOUNTER — Other Ambulatory Visit: Payer: Self-pay

## 2014-08-28 ENCOUNTER — Telehealth: Payer: Self-pay | Admitting: Internal Medicine

## 2014-08-28 NOTE — Telephone Encounter (Signed)
Pt. Needs work note stating health condition.Donald French.Donald French.Donald French.Donald French.please follow up with patient

## 2014-08-29 ENCOUNTER — Telehealth: Payer: Self-pay | Admitting: Internal Medicine

## 2014-08-29 NOTE — Telephone Encounter (Signed)
Patient called to speak to his PCP, please f/u

## 2014-09-02 ENCOUNTER — Encounter: Payer: Self-pay | Admitting: Internal Medicine

## 2014-09-02 ENCOUNTER — Ambulatory Visit: Payer: Self-pay | Attending: Internal Medicine | Admitting: Internal Medicine

## 2014-09-02 VITALS — BP 126/87 | HR 82 | Temp 98.0°F | Resp 16 | Ht 76.0 in | Wt 254.0 lb

## 2014-09-02 DIAGNOSIS — I10 Essential (primary) hypertension: Secondary | ICD-10-CM | POA: Insufficient documentation

## 2014-09-02 DIAGNOSIS — M79605 Pain in left leg: Secondary | ICD-10-CM

## 2014-09-02 DIAGNOSIS — Z7901 Long term (current) use of anticoagulants: Secondary | ICD-10-CM | POA: Insufficient documentation

## 2014-09-02 DIAGNOSIS — I82402 Acute embolism and thrombosis of unspecified deep veins of left lower extremity: Secondary | ICD-10-CM | POA: Insufficient documentation

## 2014-09-02 NOTE — Progress Notes (Signed)
MRN: [id number] Name: Donald French  Sex: male Age: 49 y.o. DOB: 12/08/1965  Allergies: Review of patient's allergies indicates no known allergies.  Chief Complaint  Patient presents with  . Follow-up    HPI: Patient is 49 y.o. male who has to of hypertension, DVT of the left lower extremity patient has already been started on Xarelto, patient comes today requesting the note for the work, he reported some swelling in the legs when he stands for long time , I have discussed with the patient Regarding  being physically but he should avoid any extraneous activities and take frequent breaks in between, patient also brought the form to be signed so that he can get the xarelto  medication with the patient assistance program.  Past Medical History  Diagnosis Date  . Hypertension     Past Surgical History  Procedure Laterality Date  . Shoulder surgery        Medication List       This list is accurate as of: 09/02/14  6:16 PM.  Always use your most recent med list.               albuterol 108 (90 BASE) MCG/ACT inhaler  Commonly known as:  PROVENTIL HFA;VENTOLIN HFA  Inhale 2 puffs into the lungs every 4 (four) hours as needed for wheezing or shortness of breath.     amLODipine 2.5 MG tablet  Commonly known as:  NORVASC  Take 1 tablet (2.5 mg total) by mouth daily.     chlorpheniramine-HYDROcodone 10-8 MG/5ML Lqcr  Commonly known as:  TUSSIONEX PENNKINETIC ER  Take 5 mLs by mouth at bedtime as needed for cough.     multivitamin with minerals Tabs tablet  Take 1 tablet by mouth daily.     XARELTO STARTER PACK 15 & 20 MG Tbpk  Generic drug:  Rivaroxaban  Take 15-20 mg by mouth as directed. Take as directed on package: Start with one  tablet by mouth twice a day with food. On Day 22, switch to one  tablet once a day with food.     rivaroxaban 20 MG Tabs tablet  Commonly known as:  XARELTO  Take 1 tablet (20 mg total) by mouth daily with supper.        No  orders of the defined types were placed in this encounter.     There is no immunization history on file for this patient.  Family History  Problem Relation Age of Onset  . Hypertension Mother   . Hypertension Father   . Diabetes Father   . Cancer Paternal Grandmother     lung cancer    History  Substance Use Topics  . Smoking status: Never Smoker   . Smokeless tobacco: Not on file  . Alcohol Use: Yes    Review of Systems   As noted in HPI  Filed Vitals:   09/02/14 1657  BP: 126/87  Pulse: 82  Temp: 98 F (36.7 C)  Resp: 16    Physical Exam  Physical Exam  Constitutional: No distress.  Eyes: EOM are normal. Pupils are equal, round, and reactive to light.  Cardiovascular: Normal rate and regular rhythm.   Pulmonary/Chest: Breath sounds normal. No respiratory distress. He has no wheezes. He has no rales.  Musculoskeletal:  Left leg is swollen compared to the right no apparent erythema but has tenderness, trace ankle edema    CBC    Component Value Date/Time   WBC 6.1 08/25/2014 1135  RBC 5.19 08/25/2014 1135   HGB 15.6 08/25/2014 1135   HCT 47.2 08/25/2014 1135   PLT 141* 08/25/2014 1135   MCV 90.9 08/25/2014 1135   LYMPHSABS 2.2 04/11/2014 1709   MONOABS 0.5 04/11/2014 1709   EOSABS 0.1 04/11/2014 1709   BASOSABS 0.0 04/11/2014 1709    CMP     Component Value Date/Time   NA 139 08/25/2014 1135   K 4.6 08/25/2014 1135   CL 107 08/25/2014 1135   CO2 24 08/25/2014 1135   GLUCOSE 101* 08/25/2014 1135   BUN 7 08/25/2014 1135   CREATININE 1.29 08/25/2014 1135   CREATININE 1.47* 04/11/2014 1709   CALCIUM 8.9 08/25/2014 1135   PROT 6.1 08/25/2014 1135   ALBUMIN 3.5 08/25/2014 1135   AST 21 08/25/2014 1135   ALT 22 08/25/2014 1135   ALKPHOS 44 08/25/2014 1135   BILITOT 0.7 08/25/2014 1135   GFRNONAA 64* 08/25/2014 1135   GFRNONAA 56* 04/11/2014 1709   GFRAA 74* 08/25/2014 1135   GFRAA 64 04/11/2014 1709    Lab Results  Component Value  Date/Time   CHOL 204* 04/11/2014 05:09 PM    No components found for: HGA1C  Lab Results  Component Value Date/Time   AST 21 08/25/2014 11:35 AM    Assessment and Plan  Leg DVT (deep venous thromboembolism), acute, left - Plan: patient is currently on Xarelto, patient is advised to be physically active but avoid  extraneous  Activities and take frequent breaks,  Also Ambulatory referral to Hematology for further recommendation and suggest on duration of treatment.  Left leg pain Tylenol as needed   Return in about 3 months (around 12/02/2014), or if symptoms worsen or fail to improve.   This note has been created with Education officer, environmentalDragon speech recognition software and smart phrase technology. Any transcriptional errors are unintentional.    Doris CheadleADVANI, Vickki Igou, MD

## 2014-09-02 NOTE — Progress Notes (Signed)
Pt is here today following up on his pain in his left leg and ankle. Pt needs paperwork filled out.

## 2014-09-05 ENCOUNTER — Telehealth: Payer: Self-pay | Admitting: Hematology and Oncology

## 2014-09-05 NOTE — Telephone Encounter (Signed)
new patient appt-left message for patient to return call to [facility]duel np appt

## 2014-09-09 ENCOUNTER — Telehealth: Payer: Self-pay | Admitting: Hematology and Oncology

## 2014-09-09 NOTE — Telephone Encounter (Signed)
NEW PATIENT APPT-LEFT MESSAGE FOR PATIENT TO RETURN CALL TO SCHEDULE NP APPT. °

## 2014-09-10 ENCOUNTER — Telehealth: Payer: Self-pay | Admitting: Hematology and Oncology

## 2014-09-10 NOTE — Telephone Encounter (Signed)
NEW PATIENT APPT-S/W PATIENT AND GAVE NP APPT FOR 04/28 @ 3:15 W/DR. GUDENA REFERRING DR. Orpah CobbADVANI DX-DVT

## 2014-09-16 ENCOUNTER — Telehealth: Payer: Self-pay | Admitting: Internal Medicine

## 2014-09-16 NOTE — Telephone Encounter (Signed)
Pt calling to follow on Xarelto paperwork he dropped off during last office visit to be filled out by PCP.  Pt is running out of medication will need paperwork to obtain refill. [facility]

## 2014-09-19 NOTE — Telephone Encounter (Signed)
Message relayed to pharmacy Spoke with Lynden Angathy and she will call the patient to set up an Appointment to fill our pass application

## 2014-09-24 ENCOUNTER — Ambulatory Visit: Payer: Self-pay | Admitting: Internal Medicine

## 2014-09-25 ENCOUNTER — Other Ambulatory Visit: Payer: No Typology Code available for payment source

## 2014-09-25 ENCOUNTER — Other Ambulatory Visit (HOSPITAL_BASED_OUTPATIENT_CLINIC_OR_DEPARTMENT_OTHER): Payer: No Typology Code available for payment source

## 2014-09-25 ENCOUNTER — Ambulatory Visit: Payer: No Typology Code available for payment source

## 2014-09-25 ENCOUNTER — Telehealth: Payer: Self-pay | Admitting: Hematology and Oncology

## 2014-09-25 ENCOUNTER — Encounter: Payer: Self-pay | Admitting: Hematology and Oncology

## 2014-09-25 ENCOUNTER — Ambulatory Visit (HOSPITAL_BASED_OUTPATIENT_CLINIC_OR_DEPARTMENT_OTHER): Payer: No Typology Code available for payment source | Admitting: Hematology and Oncology

## 2014-09-25 VITALS — BP 126/76 | HR 95 | Temp 98.5°F | Resp 18 | Ht 76.0 in | Wt 250.1 lb

## 2014-09-25 DIAGNOSIS — I82402 Acute embolism and thrombosis of unspecified deep veins of left lower extremity: Secondary | ICD-10-CM

## 2014-09-25 NOTE — Telephone Encounter (Signed)
gave and printed appt sched and avs fo rpt for May °

## 2014-09-25 NOTE — Progress Notes (Signed)
Checked in new pt with no insurance.  [facility] and he was approved for 100% discount thru [facility] eff. 04/11/14 to 10/10/14.

## 2014-09-25 NOTE — Progress Notes (Signed)
[facility] CONSULT NOTE  Patient Care Team: Doris Cheadle, MD as PCP - General (Internal Medicine)  CHIEF COMPLAINTS/PURPOSE OF CONSULTATION:  Newly diagnosed left leg DVT  HISTORY OF PRESENTING ILLNESS:  Donald French 49 y.o. male is here because of recent diagnosis of left leg DVT. Patient woke up one morning with swelling involving the left popliteal fossa and the back of his thigh and called his primary care office who sent him to emergency room. He had an ultrasound of the leg which revealed extensive DVT involving his left leg. He was started on xarelto and he has been taking that regularly. He does not seem to have any problems tolerating Xarelto. He is here to discuss the cause of his blood clot as well as to determine how long he needs to be on anticoagulation.  I reviewed her records extensively and collaborated the history with the patient.  MEDICAL HISTORY:  Past Medical History  Diagnosis Date  . Hypertension     SURGICAL HISTORY: Past Surgical History  Procedure Laterality Date  . Shoulder surgery      SOCIAL HISTORY: History   Social History  . Marital Status: Single    Spouse Name: N/A  . Number of Children: N/A  . Years of Education: N/A   Occupational History  . Not on file.   Social History Main Topics  . Smoking status: Never Smoker   . Smokeless tobacco: Not on file  . Alcohol Use: Yes  . Drug Use: No  . Sexual Activity: Not on file   Other Topics Concern  . Not on file   Social History Narrative    FAMILY HISTORY: Family History  Problem Relation Age of Onset  . Hypertension Mother   . Hypertension Father   . Diabetes Father   . Cancer Paternal Grandmother     lung cancer    ALLERGIES:  has No Known Allergies.  MEDICATIONS:  Current Outpatient Prescriptions  Medication Sig Dispense Refill  . amLODipine (NORVASC) 2.5 MG tablet Take 1 tablet (2.5 mg total) by mouth daily. 90 tablet 1  . Multiple Vitamin  (MULTIVITAMIN WITH MINERALS) TABS tablet Take 1 tablet by mouth daily.    . rivaroxaban (XARELTO) 20 MG TABS tablet Take 1 tablet (20 mg total) by mouth daily with supper. 30 tablet 3  . albuterol (PROVENTIL HFA;VENTOLIN HFA) 108 (90 BASE) MCG/ACT inhaler Inhale 2 puffs into the lungs every 4 (four) hours as needed for wheezing or shortness of breath. (Patient not taking: Reported on 09/25/2014) 1 Inhaler 0   No current facility-administered medications for this visit.    REVIEW OF SYSTEMS:   Constitutional: Denies fevers, chills or abnormal night sweats Eyes: Denies blurriness of vision, double vision or watery eyes Ears, nose, mouth, throat, and face: Denies mucositis or sore throat Respiratory: Denies cough, dyspnea or wheezes Cardiovascular: Denies palpitation, chest discomfort or lower extremity swelling Gastrointestinal:  Denies nausea, heartburn or change in bowel habits Skin: Denies abnormal skin rashes Lymphatics: Denies new lymphadenopathy or easy bruising Neurological:Denies numbness, tingling or new weaknesses Behavioral/Psych: Mood is stable, no new changes  Extremities: Improvement in the leg swelling All other systems were reviewed with the patient and are negative.  PHYSICAL EXAMINATION: ECOG PERFORMANCE STATUS: 1 - Symptomatic but completely ambulatory  Filed Vitals:   09/25/14 1500  BP: 126/76  Pulse: 95  Temp: 98.5 F (36.9 C)  Resp: 18   Filed Weights   09/25/14 1500  Weight: 250 lb 2.2 oz (  113.463 kg)    GENERAL:alert, no distress and comfortable SKIN: skin color, texture, turgor are normal, no rashes or significant lesions EYES: normal, conjunctiva are pink and non-injected, sclera clear OROPHARYNX:no exudate, no erythema and lips, buccal mucosa, and tongue normal  NECK: supple, thyroid normal size, non-tender, without nodularity LYMPH:  no palpable lymphadenopathy in the cervical, axillary or inguinal LUNGS: clear to auscultation and percussion with  normal breathing effort HEART: regular rate & rhythm and no murmurs and no lower extremity edema ABDOMEN:abdomen soft, non-tender and normal bowel sounds Musculoskeletal:no cyanosis of digits and no clubbing  PSYCH: alert & oriented x 3 with fluent speech NEURO: no focal motor/sensory deficits  LABORATORY DATA:  I have reviewed the data as listed Lab Results  Component Value Date   WBC 6.1 08/25/2014   HGB 15.6 08/25/2014   HCT 47.2 08/25/2014   MCV 90.9 08/25/2014   PLT 141* 08/25/2014   Lab Results  Component Value Date   NA 139 08/25/2014   K 4.6 08/25/2014   CL 107 08/25/2014   CO2 24 08/25/2014    RADIOGRAPHIC STUDIES: I have personally reviewed the radiological reports and agreed with the findings in the report. Ultrasound of the leg was reviewed from 08/17/2014  ASSESSMENT AND PLAN:  Left leg DVT Left leg DVT diagnosed 08/17/2014 with DVT involving left distal femoral vein, left popliteal vein, left posterior tibial vein, left peroneal vein, left gastrocnemius weighing, superficial thrombus left lesser saphenous vein.  Current treatment: Xarelto 08/17/2014  Recommendation: New onset DVT in a young individual without any predisposing factors: I recommended blood work to evaluate the following. 1. Antiphospholipid antibody panel 2. Factor V Leiden and prothrombin gene mutation 3. Serum homocysteine 4. Protein C and protein S and antithrombin III I discussed with the patient that based on current guidelines for new onset proximal vein DVT, the current duration of anticoagulation is at least 6 months.  Patient reported that he does drink alcohol and smoke weed but does not smoke cigarettes. He stays very active and plays basketball. There does not appear to be any other risk factor. He does not have any family history of blood clots.  Patient to come back to see me in 2 weeks for follow-upto discuss the results of hypercoagulability workup.     All questions were  answered. The patient knows to call the clinic with any problems, questions or concerns.    Sabas SousGudena, Jakaiden Fill K, MD 3:43 PM

## 2014-09-25 NOTE — Assessment & Plan Note (Signed)
Left leg DVT diagnosed 08/17/2014 with DVT involving left distal femoral vein, left popliteal vein, left posterior tibial vein, left peroneal vein, left gastrocnemius weighing, superficial thrombus left lesser saphenous vein.  Current treatment: Xarelto 08/17/2014  Recommendation: New onset DVT in a young individual without any predisposing factors: I recommended blood work to evaluate the following. 1. Antiphospholipid antibody panel 2. Factor V Leiden and prothrombin gene mutation 3. Serum homocysteine 4. Protein C and protein S and antithrombin III I discussed with the patient that based on current guidelines for new onset proximal vein DVT, the current duration of anticoagulation is at least 6 months.  Patient reported that he does drink alcohol and smoke weed but does not smoke cigarettes. He stays very active and plays basketball. There does not appear to be any other risk factor. He does not have any family history of blood clots.  Patient to come back to see me in 2 weeks for follow-upto discuss the results of hypercoagulability workup.

## 2014-09-29 LAB — HYPERCOAGULABLE PANEL, COMPREHENSIVE
ANTITHROMB III FUNC: 94 % (ref 76–126)
Anticardiolipin IgA: 4 APL U/mL (ref <22)
Anticardiolipin IgG: 18 GPL U/mL (ref <23)
Anticardiolipin IgM: 0 MPL U/mL (ref <11)
Beta-2 Glyco I IgG: 2 G Units (ref <20)
Beta-2-Glycoprotein I IgA: 13 A Units (ref <20)
Beta-2-Glycoprotein I IgM: 2 M Units (ref <20)
DRVVT: 42 s (ref <42.9)
Lupus Anticoagulant: NOT DETECTED
PROTEIN C ACTIVITY: 167 % — AB (ref 75–133)
PROTEIN S TOTAL: 106 % (ref 60–150)
PTT LA: 32.3 s (ref 28.0–43.0)
Protein C, Total: 100 % (ref 72–160)
Protein S Activity: 134 % — ABNORMAL HIGH (ref 69–129)

## 2014-09-29 LAB — FACTOR 8 ASSAY: COAGULATION FACTOR VIII: 137 % (ref 73–140)

## 2014-09-29 LAB — HOMOCYSTEINE: HOMOCYSTEINE: 11 umol/L (ref 4.0–15.4)

## 2014-10-09 ENCOUNTER — Ambulatory Visit (HOSPITAL_BASED_OUTPATIENT_CLINIC_OR_DEPARTMENT_OTHER): Payer: No Typology Code available for payment source | Admitting: Hematology and Oncology

## 2014-10-09 VITALS — BP 113/67 | HR 84 | Temp 98.2°F | Resp 18 | Ht 76.0 in | Wt 250.2 lb

## 2014-10-09 DIAGNOSIS — I824Z2 Acute embolism and thrombosis of unspecified deep veins of left distal lower extremity: Secondary | ICD-10-CM

## 2014-10-09 DIAGNOSIS — I82402 Acute embolism and thrombosis of unspecified deep veins of left lower extremity: Secondary | ICD-10-CM

## 2014-10-09 NOTE — Assessment & Plan Note (Signed)
Left leg DVT diagnosed 08/17/2014 with DVT involving left distal femoral vein, left popliteal vein, left posterior tibial vein, left peroneal vein, left gastrocnemius weighing, superficial thrombus left lesser saphenous vein.  Current treatment: Xarelto 08/17/2014  Results: 1. Antiphospholipid antibody panel: Normal 2. Factor V Leiden and prothrombin gene mutation: Normal 3. Serum homocysteine: 11 (Normal) 4. Protein C (167) and protein S (134) and antithrombin III Normal We could not identify any inherited or acquired hypercoagulable risk factors.  I discussed with the patient that based on current guidelines for new onset proximal vein DVT, the current duration of anticoagulation is at least 6 months which will be completed 01/17/2015.  Return to clinic as needed.

## 2014-10-09 NOTE — Progress Notes (Signed)
Patient Care Team: Doris Cheadleeepak Advani, MD as PCP - General (Internal Medicine)  DIAGNOSIS: left leg DVT Current treatment: Xarelto dose started 08/17/2014 to be completed 01/17/2015 CHIEF COMPLIANT: follow-up to discuss the blood work  INTERVAL HISTORY: Donald French is a 49 year old gentleman with above-mentioned history of left leg DVT who was been on oral anticoagulation with Xarelto and had extensive blood work done for hypercoagulability panel. He is here to discuss the results of his tests   REVIEW OF SYSTEMS:   Constitutional: Denies fevers, chills or abnormal weight loss Eyes: Denies blurriness of vision Ears, nose, mouth, throat, and face: Denies mucositis or sore throat Respiratory: Denies cough, dyspnea or wheezes Cardiovascular: Denies palpitation, chest discomfort or lower extremity swelling Gastrointestinal:  Denies nausea, heartburn or change in bowel habits Skin: Denies abnormal skin rashes Lymphatics: Denies new lymphadenopathy or easy bruising Neurological:Denies numbness, tingling or new weaknesses Behavioral/Psych: Mood is stable, no new changes  All other systems were reviewed with the patient and are negative.  I have reviewed the past medical history, past surgical history, social history and family history with the patient and they are unchanged from previous note.  ALLERGIES:  has No Known Allergies.  MEDICATIONS:  Current Outpatient Prescriptions  Medication Sig Dispense Refill  . albuterol (PROVENTIL HFA;VENTOLIN HFA) 108 (90 BASE) MCG/ACT inhaler Inhale 2 puffs into the lungs every 4 (four) hours as needed for wheezing or shortness of breath. (Patient not taking: Reported on 09/25/2014) 1 Inhaler 0  . amLODipine (NORVASC) 2.5 MG tablet Take 1 tablet (2.5 mg total) by mouth daily. 90 tablet 1  . Multiple Vitamin (MULTIVITAMIN WITH MINERALS) TABS tablet Take 1 tablet by mouth daily.    . rivaroxaban (XARELTO) 20 MG TABS tablet Take 1 tablet (20 mg total) by  mouth daily with supper. 30 tablet 3   No current facility-administered medications for this visit.    PHYSICAL EXAMINATION: ECOG PERFORMANCE STATUS: 1 - Symptomatic but completely ambulatory  There were no vitals filed for this visit. There were no vitals filed for this visit.  GENERAL:alert, no distress and comfortable SKIN: skin color, texture, turgor are normal, no rashes or significant lesions EYES: normal, Conjunctiva are pink and non-injected, sclera clear OROPHARYNX:no exudate, no erythema and lips, buccal mucosa, and tongue normal  NECK: supple, thyroid normal size, non-tender, without nodularity LYMPH:  no palpable lymphadenopathy in the cervical, axillary or inguinal LUNGS: clear to auscultation and percussion with normal breathing effort HEART: regular rate & rhythm and no murmurs and no lower extremity edema ABDOMEN:abdomen soft, non-tender and normal bowel sounds Musculoskeletal:no cyanosis of digits and no clubbing  NEURO: alert & oriented x 3 with fluent speech, no focal motor/sensory deficits  LABORATORY DATA:  I have reviewed the data as listed   Chemistry      Component Value Date/Time   NA 139 08/25/2014 1135   K 4.6 08/25/2014 1135   CL 107 08/25/2014 1135   CO2 24 08/25/2014 1135   BUN 7 08/25/2014 1135   CREATININE 1.29 08/25/2014 1135   CREATININE 1.47* 04/11/2014 1709      Component Value Date/Time   CALCIUM 8.9 08/25/2014 1135   ALKPHOS 44 08/25/2014 1135   AST 21 08/25/2014 1135   ALT 22 08/25/2014 1135   BILITOT 0.7 08/25/2014 1135       Lab Results  Component Value Date   WBC 6.1 08/25/2014   HGB 15.6 08/25/2014   HCT 47.2 08/25/2014   MCV 90.9 08/25/2014   PLT 141*  08/25/2014   NEUTROABS 3.6 04/11/2014    ASSESSMENT & PLAN:  Left leg DVT Left leg DVT diagnosed 08/17/2014 with DVT involving left distal femoral vein, left popliteal vein, left posterior tibial vein, left peroneal vein, left gastrocnemius weighing, superficial  thrombus left lesser saphenous vein.  Current treatment: Xarelto 08/17/2014  Results: 1. Antiphospholipid antibody panel: Normal 2. Factor V Leiden and prothrombin gene mutation: Normal 3. Serum homocysteine: 11 (Normal) 4. Protein C (167) and protein S (134) and antithrombin III Normal We could not identify any inherited or acquired hypercoagulable risk factors.  I discussed with the patient that based on current guidelines for new onset proximal vein DVT, the current duration of anticoagulation is at least 6 months which will be completed 01/17/2015.  Return to clinic as needed.  patient abuses multiple drugs and I counseled him that he needs to quit abusing illicit drugs.  No orders of the defined types were placed in this encounter.   The patient has a good understanding of the overall plan. he agrees with it. he will call with any problems that may develop before the next visit here.   Sabas SousGudena, Alvester Eads K, MD

## 2014-11-03 ENCOUNTER — Encounter: Payer: Self-pay | Admitting: Internal Medicine

## 2014-11-03 ENCOUNTER — Ambulatory Visit: Payer: No Typology Code available for payment source | Attending: Internal Medicine | Admitting: Internal Medicine

## 2014-11-03 VITALS — BP 121/73 | HR 75 | Temp 98.0°F | Resp 16 | Wt 247.6 lb

## 2014-11-03 DIAGNOSIS — D696 Thrombocytopenia, unspecified: Secondary | ICD-10-CM | POA: Insufficient documentation

## 2014-11-03 DIAGNOSIS — K921 Melena: Secondary | ICD-10-CM | POA: Insufficient documentation

## 2014-11-03 DIAGNOSIS — Z7901 Long term (current) use of anticoagulants: Secondary | ICD-10-CM | POA: Insufficient documentation

## 2014-11-03 DIAGNOSIS — I82402 Acute embolism and thrombosis of unspecified deep veins of left lower extremity: Secondary | ICD-10-CM | POA: Insufficient documentation

## 2014-11-03 DIAGNOSIS — M62830 Muscle spasm of back: Secondary | ICD-10-CM | POA: Insufficient documentation

## 2014-11-03 DIAGNOSIS — I10 Essential (primary) hypertension: Secondary | ICD-10-CM | POA: Insufficient documentation

## 2014-11-03 LAB — CBC WITH DIFFERENTIAL/PLATELET
BASOS PCT: 0 % (ref 0–1)
Basophils Absolute: 0 10*3/uL (ref 0.0–0.1)
Eosinophils Absolute: 0 10*3/uL (ref 0.0–0.7)
Eosinophils Relative: 1 % (ref 0–5)
HCT: 46.3 % (ref 39.0–52.0)
Hemoglobin: 15.5 g/dL (ref 13.0–17.0)
LYMPHS PCT: 42 % (ref 12–46)
Lymphs Abs: 1.8 10*3/uL (ref 0.7–4.0)
MCH: 30.1 pg (ref 26.0–34.0)
MCHC: 33.5 g/dL (ref 30.0–36.0)
MCV: 89.9 fL (ref 78.0–100.0)
MPV: 11.1 fL (ref 8.6–12.4)
Monocytes Absolute: 0.3 10*3/uL (ref 0.1–1.0)
Monocytes Relative: 7 % (ref 3–12)
Neutro Abs: 2.2 10*3/uL (ref 1.7–7.7)
Neutrophils Relative %: 50 % (ref 43–77)
Platelets: 187 10*3/uL (ref 150–400)
RBC: 5.15 MIL/uL (ref 4.22–5.81)
RDW: 13.7 % (ref 11.5–15.5)
WBC: 4.3 10*3/uL (ref 4.0–10.5)

## 2014-11-03 LAB — COMPLETE METABOLIC PANEL WITH GFR
ALT: 17 U/L (ref 0–53)
AST: 21 U/L (ref 0–37)
Albumin: 3.5 g/dL (ref 3.5–5.2)
Alkaline Phosphatase: 33 U/L — ABNORMAL LOW (ref 39–117)
BILIRUBIN TOTAL: 0.3 mg/dL (ref 0.2–1.2)
BUN: 15 mg/dL (ref 6–23)
CALCIUM: 9 mg/dL (ref 8.4–10.5)
CO2: 25 meq/L (ref 19–32)
CREATININE: 1.73 mg/dL — AB (ref 0.50–1.35)
Chloride: 106 mEq/L (ref 96–112)
GFR, EST AFRICAN AMERICAN: 53 mL/min — AB
GFR, Est Non African American: 46 mL/min — ABNORMAL LOW
GLUCOSE: 76 mg/dL (ref 70–99)
Potassium: 4.4 mEq/L (ref 3.5–5.3)
SODIUM: 140 meq/L (ref 135–145)
TOTAL PROTEIN: 5.6 g/dL — AB (ref 6.0–8.3)

## 2014-11-03 LAB — POCT URINALYSIS DIPSTICK
Bilirubin, UA: NEGATIVE
Glucose, UA: NEGATIVE
KETONES UA: NEGATIVE
Leukocytes, UA: NEGATIVE
Nitrite, UA: NEGATIVE
PH UA: 7
PROTEIN UA: NEGATIVE
SPEC GRAV UA: 1.01
Urobilinogen, UA: 0.2

## 2014-11-03 MED ORDER — HYDROCORTISONE ACETATE 25 MG RE SUPP: 25.0000 mg | Freq: Two times a day (BID) | RECTAL | Status: DC

## 2014-11-03 MED ORDER — CYCLOBENZAPRINE HCL 10 MG PO TABS: 10.0000 mg | ORAL_TABLET | Freq: Every day | ORAL | Status: DC

## 2014-11-03 NOTE — Progress Notes (Signed)
MRN: [id number] Name: Donald FearKedar M Grine  Sex: male Age: 49 y.o. DOB: 23-Oct-1965  Allergies: Review of patient's allergies indicates no known allergies.  Chief Complaint  Patient presents with  . Blood In Stools    HPI: Patient is 49 y.o. male who history of left leg DVT currently on Xarelto, history of hypertension on Norvasc comes today complaining of lower back pain which she felt after he was playing basketball, the pain is positional, denies any urinary symptoms, he is also complaining of noticing bright red blood per rectum which have been month ago and for the last few days he has noticed again patient reported straining  makes it worse, denies any nausea vomiting fever chills.  Past Medical History  Diagnosis Date  . Hypertension     Past Surgical History  Procedure Laterality Date  . Shoulder surgery        Medication List       This list is accurate as of: 11/03/14 10:27 AM.  Always use your most recent med list.               albuterol 108 (90 BASE) MCG/ACT inhaler  Commonly known as:  PROVENTIL HFA;VENTOLIN HFA  Inhale 2 puffs into the lungs every 4 (four) hours as needed for wheezing or shortness of breath.     amLODipine 2.5 MG tablet  Commonly known as:  NORVASC  Take 1 tablet (2.5 mg total) by mouth daily.     cyclobenzaprine 10 MG tablet  Commonly known as:  FLEXERIL  Take 1 tablet (10 mg total) by mouth at bedtime.     hydrocortisone 25 MG suppository  Commonly known as:  ANUSOL-HC  Place 1 suppository (25 mg total) rectally 2 (two) times daily.     multivitamin with minerals Tabs tablet  Take 1 tablet by mouth daily.     rivaroxaban 20 MG Tabs tablet  Commonly known as:  XARELTO  Take 1 tablet (20 mg total) by mouth daily with supper.        Meds ordered this encounter  Medications  . cyclobenzaprine (FLEXERIL) 10 MG tablet    Sig: Take 1 tablet (10 mg total) by mouth at bedtime.    Dispense:  30 tablet    Refill:  1  .  hydrocortisone (ANUSOL-HC) 25 MG suppository    Sig: Place 1 suppository (25 mg total) rectally 2 (two) times daily.    Dispense:  12 suppository    Refill:  0     There is no immunization history on file for this patient.  Family History  Problem Relation Age of Onset  . Hypertension Mother   . Hypertension Father   . Diabetes Father   . Cancer Paternal Grandmother     lung cancer    History  Substance Use Topics  . Smoking status: Never Smoker   . Smokeless tobacco: Not on file  . Alcohol Use: Yes    Review of Systems   As noted in HPI  Filed Vitals:   11/03/14 0943  BP: 121/73  Pulse: 75  Temp: 98 F (36.7 C)  Resp: 16    Physical Exam  Physical Exam  Constitutional: No distress.  Eyes: EOM are normal. Pupils are equal, round, and reactive to light.  Cardiovascular: Normal rate and regular rhythm.   Pulmonary/Chest: Breath sounds normal. No respiratory distress. He has no wheezes. He has no rales.  Abdominal: Soft. There is no tenderness. There is no rebound.  No  CVA tenderness  Genitourinary:  Digital rectal examination done, no perianal region, no gross bleeding    CBC    Component Value Date/Time   WBC 6.1 08/25/2014 1135   RBC 5.19 08/25/2014 1135   HGB 15.6 08/25/2014 1135   HCT 47.2 08/25/2014 1135   PLT 141* 08/25/2014 1135   MCV 90.9 08/25/2014 1135   LYMPHSABS 2.2 04/11/2014 1709   MONOABS 0.5 04/11/2014 1709   EOSABS 0.1 04/11/2014 1709   BASOSABS 0.0 04/11/2014 1709    CMP     Component Value Date/Time   NA 139 08/25/2014 1135   K 4.6 08/25/2014 1135   CL 107 08/25/2014 1135   CO2 24 08/25/2014 1135   GLUCOSE 101* 08/25/2014 1135   BUN 7 08/25/2014 1135   CREATININE 1.29 08/25/2014 1135   CREATININE 1.47* 04/11/2014 1709   CALCIUM 8.9 08/25/2014 1135   PROT 6.1 08/25/2014 1135   ALBUMIN 3.5 08/25/2014 1135   AST 21 08/25/2014 1135   ALT 22 08/25/2014 1135   ALKPHOS 44 08/25/2014 1135   BILITOT 0.7 08/25/2014 1135    GFRNONAA 64* 08/25/2014 1135   GFRNONAA 56* 04/11/2014 1709   GFRAA 74* 08/25/2014 1135   GFRAA 64 04/11/2014 1709    Lab Results  Component Value Date/Time   CHOL 204* 04/11/2014 05:09 PM    Lab Results  Component Value Date/Time   HGBA1C 5.5 04/11/2014 04:23 PM    Lab Results  Component Value Date/Time   AST 21 08/25/2014 11:35 AM    Assessment and Plan  Back muscle spasm - Plan:  Results for orders placed or performed in visit on 11/03/14  Urinalysis Dipstick  Result Value Ref Range   Color, UA yellow    Clarity, UA clear    Glucose, UA neg    Bilirubin, UA neg    Ketones, UA neg    Spec Grav, UA 1.010    Blood, UA trace-intact    pH, UA 7.0    Protein, UA neg    Urobilinogen, UA 0.2    Nitrite, UA neg    Leukocytes, UA Negative    Urinalysis Dipstick is negative for infection, UA does show some trace blood, which was also positive in the past subsequent CT abdomen pelvis was negative for any stones, will check urine for microscopic, advise patient for heating pad trial of cyclobenzaprine (FLEXERIL) 10 MG tablet, Urinalysis, Routine w reflex microscopic (not at [facility])  Leg DVT (deep venous thromboembolism), acute, left Currently on Xarelto, following up with Hematology/oncology and he has been advised to continue until the end of August.  Blood in stool - Plan:will repeat CBC with Differential/Platelet, ?secondary to hemorrhoid, we'll try hydrocortisone (ANUSOL-HC) 25 MG, versus medication induced, currently patient is on Xarelto due to DVT, does not have any frank bleeding, recheck CBC  Essential hypertension - Plan: COMPLETE METABOLIC PANEL WITH GFR  Thrombocytopenia - Plan: ?secondary to alcohol use versus Xarelto, have advised patient to cut down drinking alcohol, will recheck a CBC with Differential/Platelet    Return in about 3 months (around 02/03/2015).   This note has been created with Education officer, environmental. Any  transcriptional errors are unintentional.    Doris Cheadle, MD

## 2014-11-03 NOTE — Progress Notes (Signed)
Patient complains of having blood in his stool for the past week Patient did state at times he has to strain when having a bowel movement Patient also complains of lower left sided back pain

## 2014-11-03 NOTE — Patient Instructions (Signed)

## 2014-11-04 LAB — URINALYSIS, ROUTINE W REFLEX MICROSCOPIC
Bilirubin Urine: NEGATIVE
GLUCOSE, UA: NEGATIVE mg/dL
Hgb urine dipstick: NEGATIVE
KETONES UR: NEGATIVE mg/dL
LEUKOCYTES UA: NEGATIVE
Nitrite: NEGATIVE
PH: 7 (ref 5.0–8.0)
PROTEIN: NEGATIVE mg/dL
UROBILINOGEN UA: 0.2 mg/dL (ref 0.0–1.0)

## 2014-11-05 ENCOUNTER — Telehealth: Payer: Self-pay

## 2014-11-05 NOTE — Telephone Encounter (Signed)
Patient not available Message left on voice mail to return our call 

## 2014-11-05 NOTE — Telephone Encounter (Signed)
-----   Message from Doris Cheadleeepak Advani, MD sent at 11/04/2014 11:56 AM EDT ----- Call and let the patient know that his CBC shows normal hemoglobin level as well as his platelet level is in normal range, his urine is negative for blood.  let the patient know that his creatinine is elevated, advise patient to drink plenty of water to prevent dehydration, also avoid taking any over-the-counter NSAIDs including Aleve, ibuprofen, will repeat blood chemistry on the following visit.

## 2014-11-26 ENCOUNTER — Ambulatory Visit: Payer: Self-pay | Attending: Internal Medicine

## 2014-12-02 ENCOUNTER — Other Ambulatory Visit: Payer: Self-pay | Admitting: Internal Medicine

## 2014-12-03 ENCOUNTER — Ambulatory Visit: Payer: Self-pay | Attending: Internal Medicine | Admitting: Internal Medicine

## 2014-12-03 VITALS — BP 94/62 | HR 102 | Temp 98.2°F | Resp 18 | Ht 76.0 in | Wt 246.4 lb

## 2014-12-03 DIAGNOSIS — G8929 Other chronic pain: Secondary | ICD-10-CM

## 2014-12-03 DIAGNOSIS — M62830 Muscle spasm of back: Secondary | ICD-10-CM

## 2014-12-03 DIAGNOSIS — K029 Dental caries, unspecified: Secondary | ICD-10-CM

## 2014-12-03 DIAGNOSIS — M549 Dorsalgia, unspecified: Secondary | ICD-10-CM

## 2014-12-03 DIAGNOSIS — I959 Hypotension, unspecified: Secondary | ICD-10-CM

## 2014-12-03 MED ORDER — CYCLOBENZAPRINE HCL 10 MG PO TABS: 10.0000 mg | ORAL_TABLET | Freq: Every day | ORAL | Status: DC

## 2014-12-03 NOTE — Progress Notes (Signed)
MRN: [id number] Name: Donald French  Sex: male Age: 49 y.o. DOB: 1966/03/20  Allergies: Review of patient's allergies indicates no known allergies.  Chief Complaint  Patient presents with  . dentist referral    HPI: Patient is 49 y.o. male who history of hypertension, DVT currently on Xarelto, patient has not taken his blood pressure medication his blood pressures on lower side but patient denies any symptoms of headache dizziness chest and shortness of breath, weakness numbness, patient was given snacks and gatorade, patient also complaining of lower back discomfort, previous medical record reviewed patient had x-ray done several years ago which showed arthritis, he denies any recent fall or trauma, today his major concern is dental cavities for which he needs referral to see a dentist.   Past Medical History  Diagnosis Date  . Hypertension     Past Surgical History  Procedure Laterality Date  . Shoulder surgery        Medication List       This list is accurate as of: 12/03/14  5:46 PM.  Always use your most recent med list.               albuterol 108 (90 BASE) MCG/ACT inhaler  Commonly known as:  PROVENTIL HFA;VENTOLIN HFA  Inhale 2 puffs into the lungs every 4 (four) hours as needed for wheezing or shortness of breath.     amLODipine 2.5 MG tablet  Commonly known as:  NORVASC  Take 1 tablet (2.5 mg total) by mouth daily.     cyclobenzaprine 10 MG tablet  Commonly known as:  FLEXERIL  Take 1 tablet (10 mg total) by mouth at bedtime.     hydrocortisone 25 MG suppository  Commonly known as:  ANUSOL-HC  Place 1 suppository (25 mg total) rectally 2 (two) times daily.     multivitamin with minerals Tabs tablet  Take 1 tablet by mouth daily.     rivaroxaban 20 MG Tabs tablet  Commonly known as:  XARELTO  Take 1 tablet (20 mg total) by mouth daily with supper.        Meds ordered this encounter  Medications  . cyclobenzaprine (FLEXERIL) 10 MG tablet    Sig: Take 1 tablet (10 mg total) by mouth at bedtime.    Dispense:  30 tablet    Refill:  1     There is no immunization history on file for this patient.  Family History  Problem Relation Age of Onset  . Hypertension Mother   . Hypertension Father   . Diabetes Father   . Cancer Paternal Grandmother     lung cancer    History  Substance Use Topics  . Smoking status: Never Smoker   . Smokeless tobacco: Not on file  . Alcohol Use: Yes    Review of Systems   As noted in HPI  Filed Vitals:   12/03/14 1732  BP: 94/62  Pulse: 102  Temp:   Resp:     Physical Exam  Physical Exam  Constitutional: He is oriented to person, place, and time. No distress.  Eyes: EOM are normal. Pupils are equal, round, and reactive to light.  Cardiovascular: Normal rate and regular rhythm.   Pulmonary/Chest: Breath sounds normal. No respiratory distress. He has no wheezes. He has no rales.  Musculoskeletal: He exhibits no edema.  Some lower lumbar paraspinal tenderness, SLR test negative, equal strength both lower extremities  Neurological: He is alert and oriented to person, place, and time.  CBC    Component Value Date/Time   WBC 4.3 11/03/2014 1017   RBC 5.15 11/03/2014 1017   HGB 15.5 11/03/2014 1017   HCT 46.3 11/03/2014 1017   PLT 187 11/03/2014 1017   MCV 89.9 11/03/2014 1017   LYMPHSABS 1.8 11/03/2014 1017   MONOABS 0.3 11/03/2014 1017   EOSABS 0.0 11/03/2014 1017   BASOSABS 0.0 11/03/2014 1017    CMP     Component Value Date/Time   NA 140 11/03/2014 1017   K 4.4 11/03/2014 1017   CL 106 11/03/2014 1017   CO2 25 11/03/2014 1017   GLUCOSE 76 11/03/2014 1017   BUN 15 11/03/2014 1017   CREATININE 1.73* 11/03/2014 1017   CREATININE 1.29 08/25/2014 1135   CALCIUM 9.0 11/03/2014 1017   PROT 5.6* 11/03/2014 1017   ALBUMIN 3.5 11/03/2014 1017   AST 21 11/03/2014 1017   ALT 17 11/03/2014 1017   ALKPHOS 33* 11/03/2014 1017   BILITOT 0.3 11/03/2014 1017   GFRNONAA  46* 11/03/2014 1017   GFRNONAA 64* 08/25/2014 1135   GFRAA 53* 11/03/2014 1017   GFRAA 74* 08/25/2014 1135    Lab Results  Component Value Date/Time   CHOL 204* 04/11/2014 05:09 PM    Lab Results  Component Value Date/Time   HGBA1C 5.5 04/11/2014 04:23 PM    Lab Results  Component Value Date/Time   AST 21 11/03/2014 10:17 AM    Assessment and Plan  Back muscle spasm - Plan: advised patient to apply heating pad cyclobenzaprine (FLEXERIL) 10 MG tablet  Hypotension, unspecified hypotension type Patient will start taking blood pressure medication Norvasc, his blood pressures on lower side asymptomatic, he will come back in 2 weeks time for repeat BP check, he has been given clear instructions to go to the emergency room or get medical attention if he has any of the symptoms secondary to low blood pressure including headache dizziness numbness weakness chest and shortness of breath. Patient understands verbalized instructions.  Dental cavities - Plan: Ambulatory referral to Dentistry  Chronic back pain - Plan:  We'll get DG Lumbar Spine Complete   Return in about 3 months (around 03/05/2015), or if symptoms worsen or fail to improve, for BP check in 2 weeks/Nurse Visit.   This note has been created with Education officer, environmentalDragon speech recognition software and smart phrase technology. Any transcriptional errors are unintentional.    Doris CheadleADVANI, Linsay Vogt, MD

## 2014-12-03 NOTE — Progress Notes (Signed)
Patient blood pressure still low Patient stated he had to pick up his son and had to leave Left AMA

## 2014-12-03 NOTE — Progress Notes (Signed)
Patient here today to pick up orange card, obtain referral to dentist, and complaints of low back pain.  Patient states back has been bothering him for about 1 week, and comes and goes with certain movements, described as a "nag."  Patient not currently in pain.  Patient needs refill on Amlodipine.

## 2014-12-15 ENCOUNTER — Telehealth: Payer: Self-pay | Admitting: *Deleted

## 2014-12-15 ENCOUNTER — Ambulatory Visit: Payer: Self-pay

## 2014-12-15 NOTE — Telephone Encounter (Signed)
Patient left before being seen for BP recheck States he's been feeling fine Denies any s/sx of low blood pressure such as headache, dizziness, numbness, weakness, chest pain, SHOB Patient states he will check BP today. Patient instructed to call office if systolic BP < 100. Patient states understanding

## 2015-03-18 ENCOUNTER — Other Ambulatory Visit: Payer: Self-pay | Admitting: Internal Medicine

## 2015-03-25 NOTE — Telephone Encounter (Signed)
Pt. Called requesting a med refill on rivaroxaban (XARELTO) 20 MG TABS tablet. Pt. Has been out of the medication for about a week. Please f/u with pt.

## 2015-08-29 IMAGING — CT CT RENAL STONE PROTOCOL
2 of 4 series · 17 of 46 positions shown, 19 images · non-contrast
Comparison: None.

CLINICAL DATA: Hematuria x1 week

EXAM:
CT ABDOMEN AND PELVIS WITHOUT CONTRAST
TECHNIQUE: Multidetector CT imaging of the abdomen and pelvis was performed
following the standard protocol without IV contrast.

[Series 2: stone study 5.0 i30f 1 · axial · 0.76mm/px · z∈[-322,+128]mm · 14 of 98 slices shown, 16 images]
[im 4/98  soft-tissue]
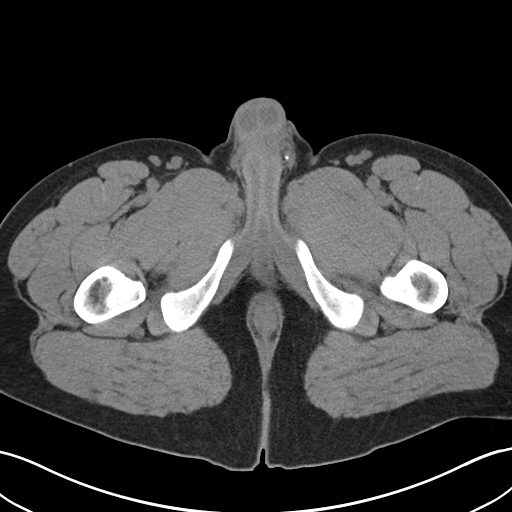
[im 4/98  bone]
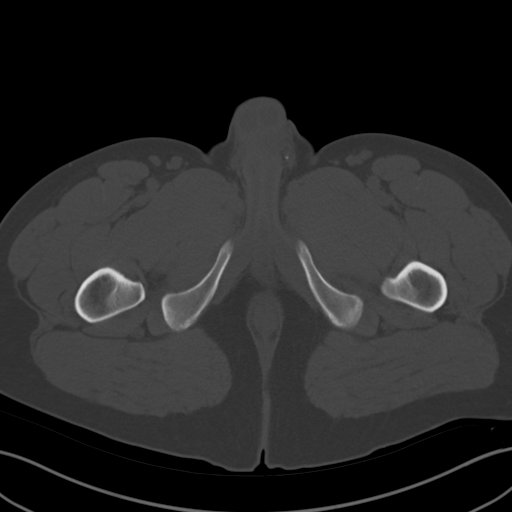
[im 12/98  soft-tissue]
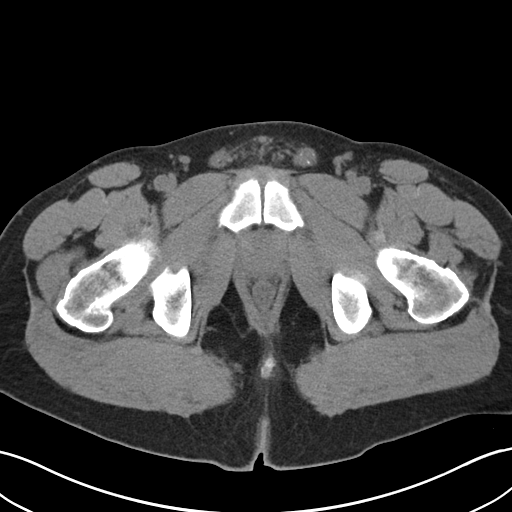
[im 20/98  soft-tissue]
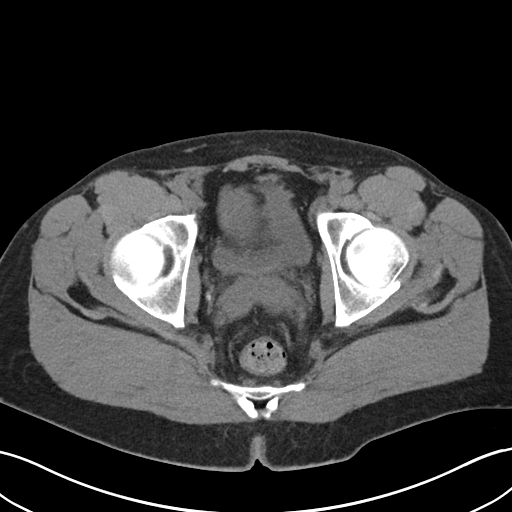
[im 28/98  soft-tissue]
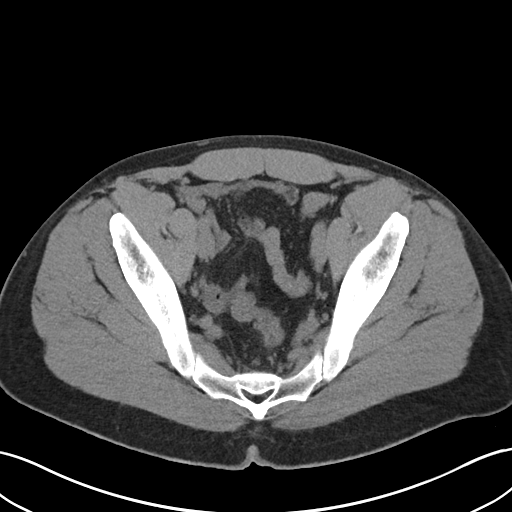
[im 32/98  soft-tissue]
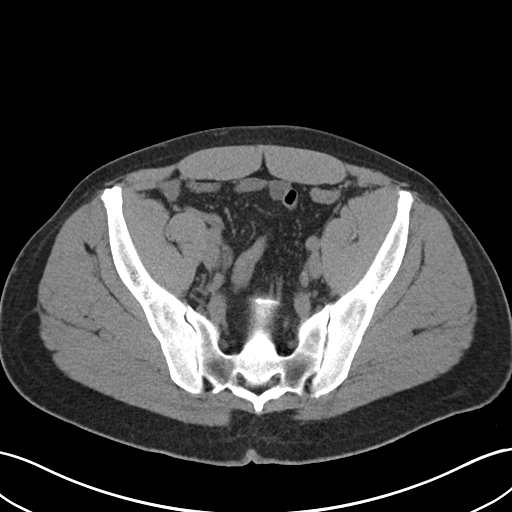
[im 39/98  soft-tissue]
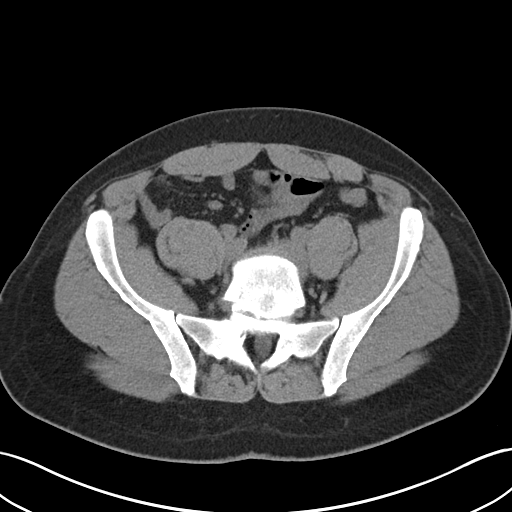
[im 47/98  soft-tissue]
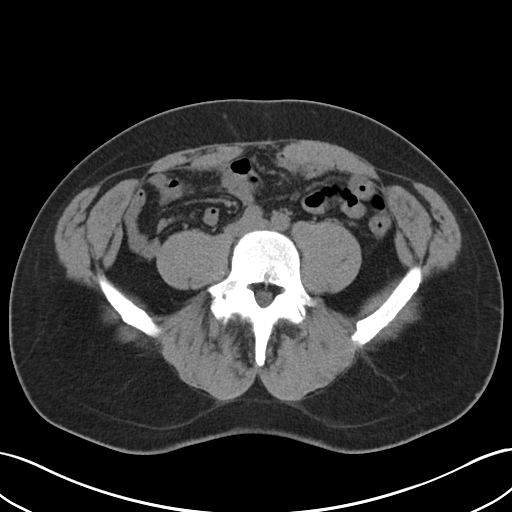
[im 51/98  soft-tissue]
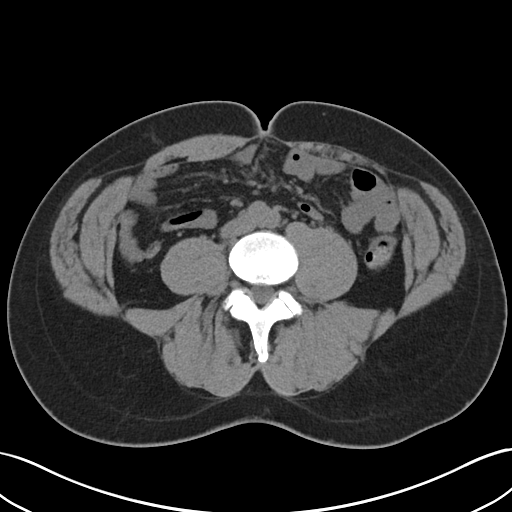
[im 59/98  soft-tissue]
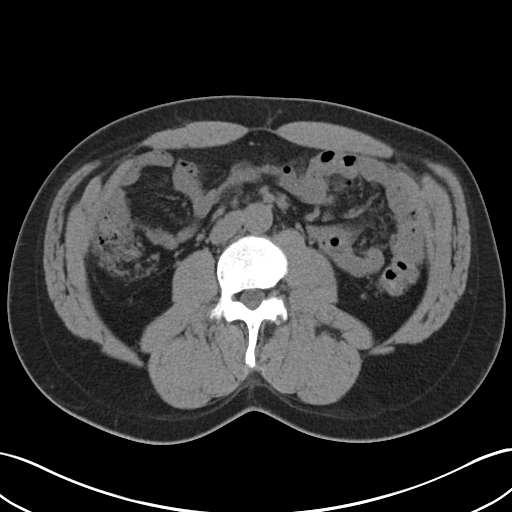
[im 59/98  bone]
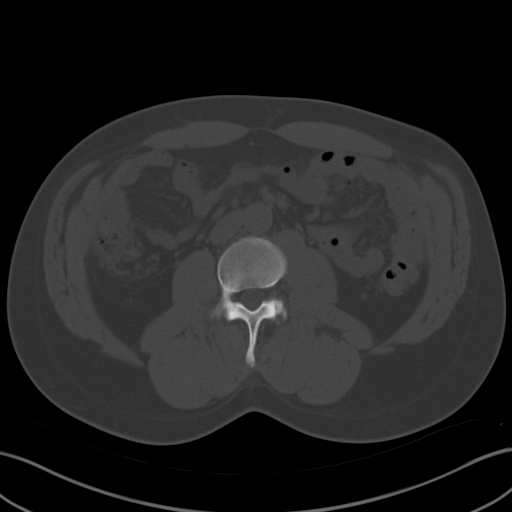
[im 66/98  soft-tissue]
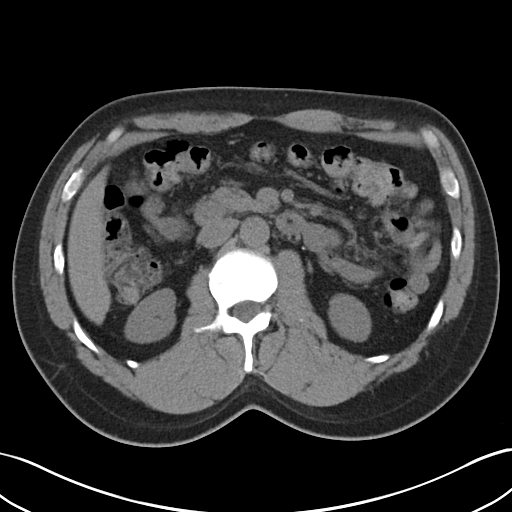
[im 74/98  soft-tissue]
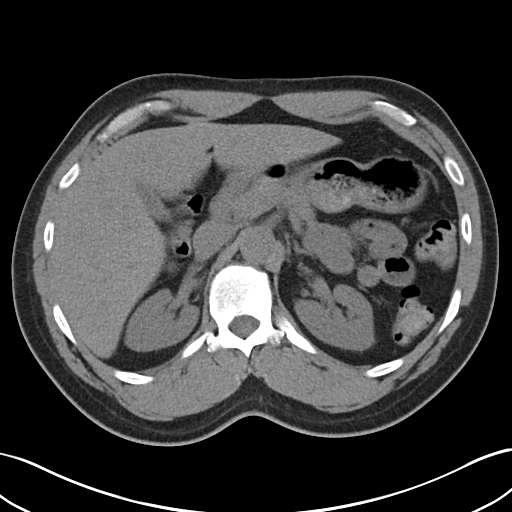
[im 78/98  soft-tissue]
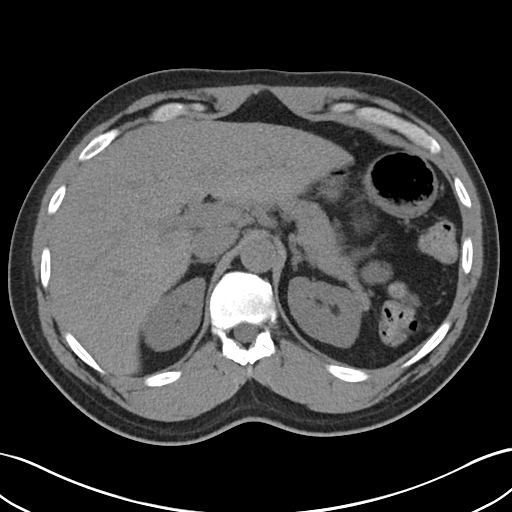
[im 86/98  soft-tissue]
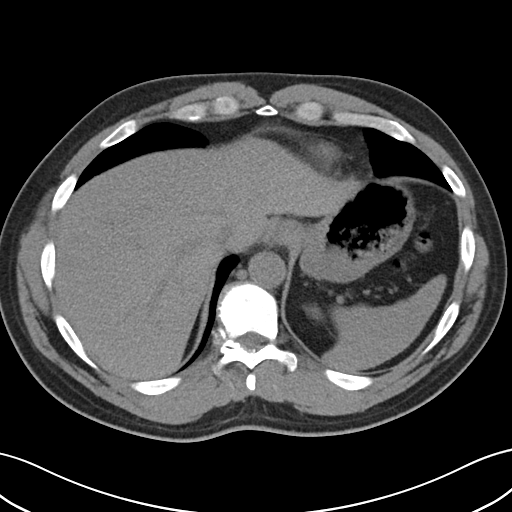
[im 94/98  soft-tissue]
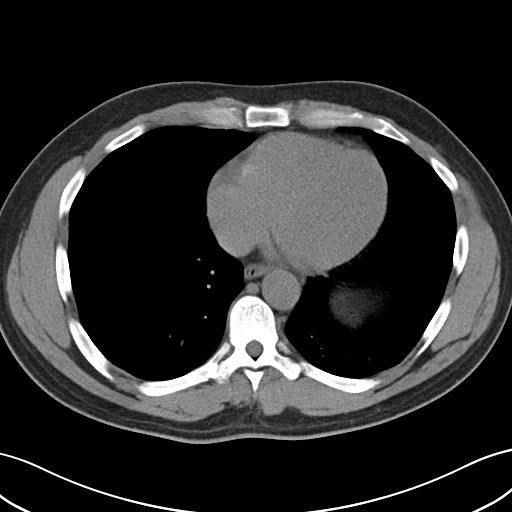

[Series 5: coronal soft tissue · coronal · 0.95mm/px · 3 of 97 slices shown]
[im 33/97  soft-tissue]
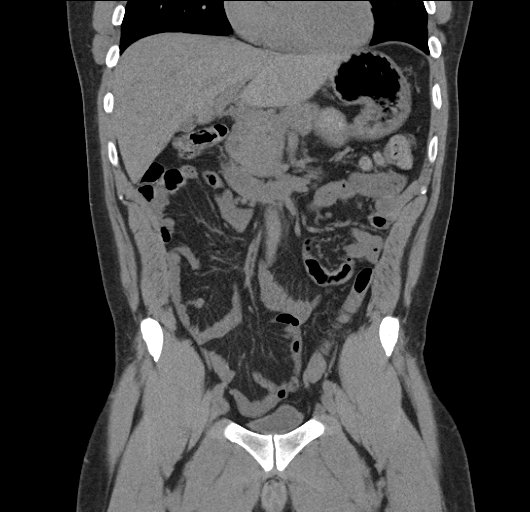
[im 43/97  soft-tissue]
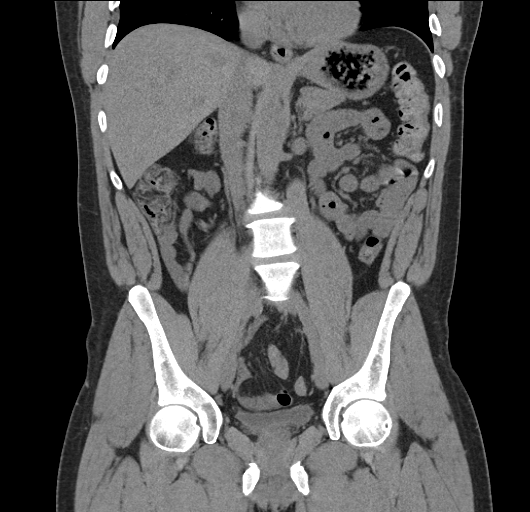
[im 54/97  soft-tissue]
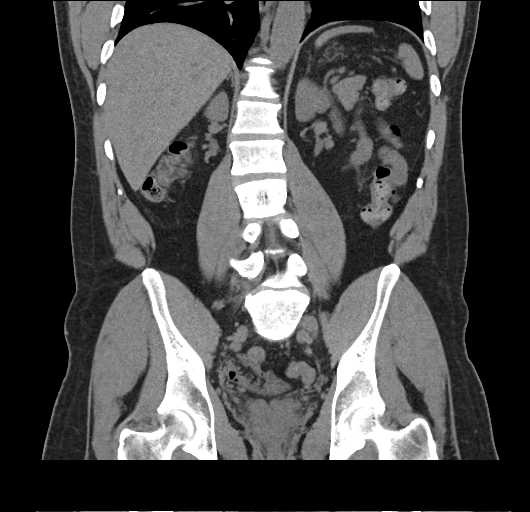

[17 of 46 positions shown; findings below may reference images not displayed]

FINDINGS: Lower chest:  Lung bases are clear.

Hepatobiliary: Unenhanced liver is within normal limits.

Gallbladder is underdistended. No intrahepatic or extrahepatic
ductal dilatation.

Pancreas: Within normal limits.

Spleen: Within normal limits.

Adrenals/Urinary Tract: Adrenal glands are unremarkable.

Kidneys are within normal limits. No renal calculi or
hydronephrosis.

No ureteral or bladder calculi.

Bladder is underdistended but unremarkable.

Stomach/Bowel: Stomach is within normal limits.

No evidence of bowel obstruction.

Normal appendix.

Vascular/Lymphatic: No evidence of abdominal aortic aneurysm.

No suspicious abdominopelvic lymphadenopathy.

Reproductive: Prostate is unremarkable.

Other: No abdominopelvic ascites.

Musculoskeletal: Mild degenerative changes of the lumbar spine.
IMPRESSION: No renal, ureteral, or bladder calculi.  No hydronephrosis.

Negative CT abdomen/pelvis.

## 2019-12-13 ENCOUNTER — Encounter: Payer: Self-pay | Admitting: Internal Medicine

## 2019-12-13 ENCOUNTER — Ambulatory Visit: Payer: Self-pay | Attending: Internal Medicine | Admitting: Internal Medicine

## 2019-12-13 ENCOUNTER — Other Ambulatory Visit: Payer: Self-pay

## 2019-12-13 VITALS — BP 124/82 | HR 80 | Resp 16 | Ht 76.0 in | Wt 265.2 lb

## 2019-12-13 DIAGNOSIS — Z125 Encounter for screening for malignant neoplasm of prostate: Secondary | ICD-10-CM

## 2019-12-13 DIAGNOSIS — Z2821 Immunization not carried out because of patient refusal: Secondary | ICD-10-CM | POA: Insufficient documentation

## 2019-12-13 DIAGNOSIS — Z6832 Body mass index (BMI) 32.0-32.9, adult: Secondary | ICD-10-CM | POA: Insufficient documentation

## 2019-12-13 DIAGNOSIS — E6609 Other obesity due to excess calories: Secondary | ICD-10-CM | POA: Insufficient documentation

## 2019-12-13 DIAGNOSIS — I10 Essential (primary) hypertension: Secondary | ICD-10-CM

## 2019-12-13 DIAGNOSIS — Z1211 Encounter for screening for malignant neoplasm of colon: Secondary | ICD-10-CM

## 2019-12-13 DIAGNOSIS — I8392 Asymptomatic varicose veins of left lower extremity: Secondary | ICD-10-CM | POA: Insufficient documentation

## 2019-12-13 DIAGNOSIS — Z1159 Encounter for screening for other viral diseases: Secondary | ICD-10-CM

## 2019-12-13 NOTE — Patient Instructions (Addendum)
Your blood pressure is close to normal.  Healthy eating habits and regular exercise will further help to lower your blood pressure to within normal range.  We do not need to restart blood pressure medication at this time.  Once you have been approved for the orange card/cone discount. We will bring you back in 6 weeks.  If you have been approved, we will submit referral for colonoscopy   Healthy Eating Following a healthy eating pattern may help you to achieve and maintain a healthy body weight, reduce the risk of chronic disease, and live a long and productive life. It is important to follow a healthy eating pattern at an appropriate calorie level for your body. Your nutritional needs should be met primarily through food by choosing a variety of nutrient-rich foods. What are tips for following this plan? Reading food labels  Read labels and choose the following: ? Reduced or low sodium. ? Juices with 100% fruit juice. ? Foods with low saturated fats and high polyunsaturated and monounsaturated fats. ? Foods with whole grains, such as whole wheat, cracked wheat, brown rice, and wild rice. ? Whole grains that are fortified with folic acid. This is recommended for women who are pregnant or who want to become pregnant.  Read labels and avoid the following: ? Foods with a lot of added sugars. These include foods that contain brown sugar, corn sweetener, corn syrup, dextrose, fructose, glucose, high-fructose corn syrup, honey, invert sugar, lactose, malt syrup, maltose, molasses, raw sugar, sucrose, trehalose, or turbinado sugar.  Do not eat more than the following amounts of added sugar per day:  6 teaspoons (25 g) for women.  9 teaspoons (38 g) for men. ? Foods that contain processed or refined starches and grains. ? Refined grain products, such as white flour, degermed cornmeal, white bread, and white rice. Shopping  Choose nutrient-rich snacks, such as vegetables, whole fruits, and nuts.  Avoid high-calorie and high-sugar snacks, such as potato chips, fruit snacks, and candy.  Use oil-based dressings and spreads on foods instead of solid fats such as butter, stick margarine, or cream cheese.  Limit pre-made sauces, mixes, and "instant" products such as flavored rice, instant noodles, and ready-made pasta.  Try more plant-protein sources, such as tofu, tempeh, black beans, edamame, lentils, nuts, and seeds.  Explore eating plans such as the Mediterranean diet or vegetarian diet. Cooking  Use oil to saut or stir-fry foods instead of solid fats such as butter, stick margarine, or lard.  Try baking, boiling, grilling, or broiling instead of frying.  Remove the fatty part of meats before cooking.  Steam vegetables in water or broth. Meal planning   At meals, imagine dividing your plate into fourths: ? One-half of your plate is fruits and vegetables. ? One-fourth of your plate is whole grains. ? One-fourth of your plate is protein, especially lean meats, poultry, eggs, tofu, beans, or nuts.  Include low-fat dairy as part of your daily diet. Lifestyle  Choose healthy options in all settings, including home, work, school, restaurants, or stores.  Prepare your food safely: ? Wash your hands after handling raw meats. ? Keep food preparation surfaces clean by regularly washing with hot, soapy water. ? Keep raw meats separate from ready-to-eat foods, such as fruits and vegetables. ? Cook seafood, meat, poultry, and eggs to the recommended internal temperature. ? Store foods at safe temperatures. In general:  Keep cold foods at 23F (4.4C) or below.  Keep hot foods at 123F (60C) or above.  Keep your freezer at [facility] (-17.8C) or below.  Foods are no longer safe to eat when they have been between the temperatures of 40-140F (4.4-60C) for more than 2 hours. What foods should I eat? Fruits Aim to eat 2 cup-equivalents of fresh, canned (in natural juice), or frozen  fruits each day. Examples of 1 cup-equivalent of fruit include 1 small apple, 8 large strawberries, 1 cup canned fruit,  cup dried fruit, or 1 cup 100% juice. Vegetables Aim to eat 2-3 cup-equivalents of fresh and frozen vegetables each day, including different varieties and colors. Examples of 1 cup-equivalent of vegetables include 2 medium carrots, 2 cups raw, leafy greens, 1 cup chopped vegetable (raw or cooked), or 1 medium baked potato. Grains Aim to eat 6 ounce-equivalents of whole grains each day. Examples of 1 ounce-equivalent of grains include 1 slice of bread, 1 cup ready-to-eat cereal, 3 cups popcorn, or  cup cooked rice, pasta, or cereal. Meats and other proteins Aim to eat 5-6 ounce-equivalents of protein each day. Examples of 1 ounce-equivalent of protein include 1 egg, 1/2 cup nuts or seeds, or 1 tablespoon (16 g) peanut butter. A cut of meat or fish that is the size of a deck of cards is about 3-4 ounce-equivalents.  Of the protein you eat each week, try to have at least 8 ounces come from seafood. This includes salmon, trout, herring, and anchovies. Dairy Aim to eat 3 cup-equivalents of fat-free or low-fat dairy each day. Examples of 1 cup-equivalent of dairy include 1 cup (240 mL) milk, 8 ounces (250 g) yogurt, 1 ounces (44 g) natural cheese, or 1 cup (240 mL) fortified soy milk. Fats and oils  Aim for about 5 teaspoons (21 g) per day. Choose monounsaturated fats, such as canola and olive oils, avocados, peanut butter, and most nuts, or polyunsaturated fats, such as sunflower, corn, and soybean oils, walnuts, pine nuts, sesame seeds, sunflower seeds, and flaxseed. Beverages  Aim for six 8-oz glasses of water per day. Limit coffee to three to five 8-oz cups per day.  Limit caffeinated beverages that have added calories, such as soda and energy drinks.  Limit alcohol intake to no more than 1 drink a day for nonpregnant women and 2 drinks a day for men. One drink equals 12 oz  of beer (355 mL), 5 oz of wine (148 mL), or 1 oz of hard liquor (44 mL). Seasoning and other foods  Avoid adding excess amounts of salt to your foods. Try flavoring foods with herbs and spices instead of salt.  Avoid adding sugar to foods.  Try using oil-based dressings, sauces, and spreads instead of solid fats. This information is based on general [place] nutrition guidelines. For more information, visit [employer]. Exact amounts may vary based on your nutrition needs. Summary  A healthy eating plan may help you to maintain a healthy weight, reduce the risk of chronic diseases, and stay active throughout your life.  Plan your meals. Make sure you eat the right portions of a variety of nutrient-rich foods.  Try baking, boiling, grilling, or broiling instead of frying.  Choose healthy options in all settings, including home, work, school, restaurants, or stores. This information is not intended to replace advice given to you by your health care provider. Make sure you discuss any questions you have with your health care provider. Document Revised: 08/28/2017 Document Reviewed: 08/28/2017 Elsevier Patient Education  [place].

## 2019-12-13 NOTE — Progress Notes (Signed)
Patient ID: Donald French, male    DOB: 03-26-1966  MRN: [id number]  CC: New Patient (Initial Visit)   Subjective: Donald French is a 54 y.o. male who presents for new pt visit. His concerns today include:  Hx of HTN, DVT, LLE  Seen here several yrs. Pt with past hx of DVT in LLE and HTN.  Currently not on any meds.  Noticed some varicose veins in LT popliteal area.  Not painful but feels we should keep an eye on it because this was the leg in which she had a DVT before.. No lower extremity edema at this time.  Wants prostate cancer. No fhx of prostate CA.  Passing urine okay.  No blood in urine  Wants colonoscopy for colon cancer screen. No fhx of colon CA No blood in stools. Moving bowels okay.  Currently uninsured.  Admits that he has been over eating, more consumption of junk foods and drinking sodas. Exercises daily for 45 minutes to 1 hour.  Every other day he runs and does push-ups.  HM:  Does not want COVID vaccine.  Does not trust hx of vaccine.   Patient Active Problem List   Diagnosis Date Noted  . COVID-19 virus vaccination declined 12/13/2019  . Class 1 obesity due to excess calories without serious comorbidity with body mass index (BMI) of 32.0 to 32.9 in adult 12/13/2019  . Left leg DVT (HCC) 09/25/2014  . Family history of prostate cancer 04/11/2014  . Hematuria 04/11/2014  . Essential hypertension 04/11/2014  . URI (upper respiratory infection) 04/10/2014     Current Outpatient Medications on File Prior to Visit  Medication Sig Dispense Refill  . amLODipine (NORVASC) 2.5 MG tablet TAKE 1 TABLET BY MOUTH ONCE DAILY (Patient not taking: Reported on 12/13/2019) 90 tablet 1   No current facility-administered medications on file prior to visit.    No Known Allergies  Social History   Socioeconomic History  . Marital status: Single    Spouse name: Not on file  . Number of children: 0  . Years of education: Not on file  . Highest education level:  Bachelor's degree (e.g., BA, AB, BS)  Occupational History  . Occupation: light industrial  Tobacco Use  . Smoking status: Never Smoker  . Smokeless tobacco: Never Used  Vaping Use  . Vaping Use: Never used  Substance and Sexual Activity  . Alcohol use: Not Currently  . Drug use: No  . Sexual activity: Not on file  Other Topics Concern  . Not on file  Social History Narrative  . Not on file   Social Determinants of Health   Financial Resource Strain:   . Difficulty of Paying Living Expenses:   Food Insecurity:   . Worried About Programme researcher, broadcasting/film/video in the Last Year:   . Barista in the Last Year:   Transportation Needs:   . Freight forwarder (Medical):   Marland Kitchen Lack of Transportation (Non-Medical):   Physical Activity:   . Days of Exercise per Week:   . Minutes of Exercise per Session:   Stress:   . Feeling of Stress :   Social Connections:   . Frequency of Communication with Friends and Family:   . Frequency of Social Gatherings with Friends and Family:   . Attends Religious Services:   . Active Member of Clubs or Organizations:   . Attends Banker Meetings:   Marland Kitchen Marital Status:   Intimate Partner Violence:   .  Fear of Current or Ex-Partner:   . Emotionally Abused:   Marland Kitchen Physically Abused:   . Sexually Abused:     Family History  Problem Relation Age of Onset  . Hypertension Mother   . Hypertension Father   . Diabetes Father   . Cancer Paternal Grandmother        lung cancer    Past Surgical History:  Procedure Laterality Date  . SHOULDER SURGERY      ROS: Review of Systems Negative except as stated above  PHYSICAL EXAM: BP 124/82   Pulse 80   Resp 16   Ht 6\' 4"  (1.93 m)   Wt 265 lb 3.2 oz (120.3 kg)   SpO2 98%   BMI 32.28 kg/m   Physical Exam General appearance - alert, well appearing, middle-aged African-American male and in no distress Mental status - normal mood, behavior, speech, dress, motor activity, and thought  processes Eyes - pupils equal and reactive, extraocular eye movements intact Nose - normal and patent, no erythema, discharge or polyps Mouth - mucous membranes moist, pharynx normal without lesions Neck - supple, no significant adenopathy Lymphatics - no palpable lymphadenopathy, no hepatosplenomegaly Chest - clear to auscultation, no wheezes, rales or rhonchi, symmetric air entry Heart - normal rate, regular rhythm, normal S1, S2, no murmurs, rubs, clicks or gallops Abdomen - soft, nontender, nondistended, no masses or organomegaly Rectal -CMA Pollock present.  No external hemorrhoid.  I was able to feel about the lower third of the prostate.  It does not feel enlarged and no nodules palpated Extremities - peripheral pulses normal, no pedal edema, no clubbing or cyanosis.  He has small patch of spider veins in the  popliteal area left side laterally  CMP Latest Ref Rng & Units 11/03/2014 08/25/2014 04/11/2014  Glucose 70 - 99 mg/dL 76 04/13/2014) 84  BUN 6 - 23 mg/dL 15 7 12   Creatinine 0.50 - 1.35 mg/dL 518(A) 4.16(S)  Sodium 135 - 145 mEq/L 140 139 144  Potassium 3.5 - 5.3 mEq/L 4.4 4.6 4.5  Chloride 96 - 112 mEq/L 106 107 99  CO2 19 - 32 mEq/L 25 24 24   Calcium 8.4 - 10.5 mg/dL 9.0 8.9 9.5  Total Protein 6.0 - 8.3 g/dL 0.63) 6.1 6.7  Total Bilirubin 0.2 - 1.2 mg/dL 0.3 0.7 0.3  Alkaline Phos 39 - 117 U/L 33(L) 44 38(L)  AST 0 - 37 U/L 21 21 30   ALT 0 - 53 U/L 17 22 27    Lipid Panel     Component Value Date/Time   CHOL 204 (H) 04/11/2014 1709   TRIG 279 (H) 04/11/2014 1709   HDL 43 04/11/2014 1709   CHOLHDL 4.7 04/11/2014 1709   VLDL 56 (H) 04/11/2014 1709   LDLCALC 105 (H) 04/11/2014 1709    CBC    Component Value Date/Time   WBC 4.3 11/03/2014 1017   RBC 5.15 11/03/2014 1017   HGB 15.5 11/03/2014 1017   HCT 46.3 11/03/2014 1017   PLT 187 11/03/2014 1017   MCV 89.9 11/03/2014 1017   MCH 30.1 11/03/2014 1017   MCHC 33.5 11/03/2014 1017   RDW 13.7 11/03/2014 1017    LYMPHSABS 1.8 11/03/2014 1017   MONOABS 0.3 11/03/2014 1017   EOSABS 0.0 11/03/2014 1017   BASOSABS 0.0 11/03/2014 1017    ASSESSMENT AND PLAN: 1. Essential hypertension -DBP only mildly above goal off med.  I think we can continue to leave him off medication and observe.  DASH diet discussed and  encouraged.  Continue regular exercise.  2. Class 1 obesity due to excess calories without serious comorbidity with body mass index (BMI) of 32.0 to 32.9 in adult Healthy eating habits discussed.  Encouraged him to eliminate sugary drinks from the diet.  Cut back on consumption of white carbohydrates, eat more lean white meat instead of red meat and incorporate fresh fruits and vegetables into the diet.  Printed information given.  Encouraged him to continue regular exercise as he has been doing. - CBC - Comprehensive metabolic panel - Lipid panel - Hemoglobin A1c  3. Screening for colon cancer Patient will be given the forms for the orange card/cone discount.  He will fill them out.  We will have him follow-up in 6 weeks.  If he has been approved we will refer for colonoscopy.  4. Prostate cancer screening Discussed limitations of prostate cancer screening with PSA.  Patient agreeable for screening. - PSA  5. Asymptomatic varicose veins of left lower extremity Observe.  6. Need for hepatitis C screening test - Hepatitis C Antibody  7. COVID-19 virus vaccination declined Discussed COVID-19 vaccination.  Advised patient that vaccines are relatively safe and effective.  I have encouraged him to get vaccinated but patient still declines.   Patient was given the opportunity to ask questions.  Patient verbalized understanding of the plan and was able to repeat key elements of the plan.   Orders Placed This Encounter  Procedures  . CBC  . Comprehensive metabolic panel  . Lipid panel  . PSA  . Hepatitis C Antibody  . Hemoglobin A1c     Requested Prescriptions    No prescriptions  requested or ordered in this encounter    Return in about 6 weeks (around 01/24/2020).  Jonah Blue, MD, FACP

## 2019-12-14 LAB — CBC
Hematocrit: 47.2 % (ref 37.5–51.0)
Hemoglobin: 15.5 g/dL (ref 13.0–17.7)
MCH: 29.9 pg (ref 26.6–33.0)
MCHC: 32.8 g/dL (ref 31.5–35.7)
MCV: 91 fL (ref 79–97)
Platelets: 143 10*3/uL — ABNORMAL LOW (ref 150–450)
RBC: 5.19 x10E6/uL (ref 4.14–5.80)
RDW: 12.7 % (ref 11.6–15.4)
WBC: 4.8 10*3/uL (ref 3.4–10.8)

## 2019-12-14 LAB — LIPID PANEL
Chol/HDL Ratio: 3.8 ratio (ref 0.0–5.0)
Cholesterol, Total: 196 mg/dL (ref 100–199)
HDL: 52 mg/dL (ref >39)
LDL Chol Calc (NIH): 132 mg/dL — ABNORMAL HIGH (ref 0–99)
Triglycerides: 65 mg/dL (ref 0–149)
VLDL Cholesterol Cal: 12 mg/dL (ref 5–40)

## 2019-12-14 LAB — COMPREHENSIVE METABOLIC PANEL
ALT: 35 IU/L (ref 0–44)
AST: 29 IU/L (ref 0–40)
Albumin/Globulin Ratio: 1.9 (ref 1.2–2.2)
Albumin: 3.8 g/dL (ref 3.8–4.9)
Alkaline Phosphatase: 45 IU/L — ABNORMAL LOW (ref 48–121)
BUN/Creatinine Ratio: 7 — ABNORMAL LOW (ref 9–20)
BUN: 9 mg/dL (ref 6–24)
Bilirubin Total: 0.4 mg/dL (ref 0.0–1.2)
CO2: 28 mmol/L (ref 20–29)
Calcium: 8.8 mg/dL (ref 8.7–10.2)
Chloride: 103 mmol/L (ref 96–106)
Creatinine, Ser: 1.31 mg/dL — ABNORMAL HIGH (ref 0.76–1.27)
GFR calc Af Amer: 71 mL/min/{1.73_m2} (ref >59)
GFR calc non Af Amer: 62 mL/min/{1.73_m2} (ref >59)
Globulin, Total: 2 g/dL (ref 1.5–4.5)
Glucose: 82 mg/dL (ref 65–99)
Potassium: 5.1 mmol/L (ref 3.5–5.2)
Sodium: 142 mmol/L (ref 134–144)
Total Protein: 5.8 g/dL — ABNORMAL LOW (ref 6.0–8.5)

## 2019-12-14 LAB — HEPATITIS C ANTIBODY: Hep C Virus Ab: <0.1 s/co ratio (ref 0.0–0.9)

## 2019-12-14 LAB — PSA: Prostate Specific Ag, Serum: 2.5 ng/mL (ref 0.0–4.0)

## 2019-12-14 LAB — HEMOGLOBIN A1C
Est. average glucose Bld gHb Est-mCnc: 117 mg/dL
Hgb A1c MFr Bld: 5.7 % — ABNORMAL HIGH (ref 4.8–5.6)

## 2019-12-15 ENCOUNTER — Encounter: Payer: Self-pay | Admitting: Internal Medicine

## 2019-12-15 DIAGNOSIS — R7303 Prediabetes: Secondary | ICD-10-CM | POA: Insufficient documentation

## 2019-12-15 DIAGNOSIS — E785 Hyperlipidemia, unspecified: Secondary | ICD-10-CM | POA: Insufficient documentation

## 2019-12-16 ENCOUNTER — Telehealth: Payer: Self-pay

## 2019-12-16 NOTE — Telephone Encounter (Signed)
Contacted pt to go over lab results pt vm is not set up. 

## 2019-12-17 NOTE — Progress Notes (Signed)
Returned pt call and went over the clarification for the kidney function

## 2019-12-24 ENCOUNTER — Other Ambulatory Visit: Payer: Self-pay

## 2019-12-24 ENCOUNTER — Ambulatory Visit: Payer: Self-pay | Attending: Internal Medicine

## 2020-01-24 ENCOUNTER — Ambulatory Visit: Payer: Medicaid Other | Admitting: Internal Medicine

## 2020-03-17 ENCOUNTER — Other Ambulatory Visit: Payer: Self-pay

## 2020-03-17 ENCOUNTER — Encounter: Payer: Self-pay | Admitting: Internal Medicine

## 2020-03-17 ENCOUNTER — Ambulatory Visit: Payer: Self-pay | Attending: Internal Medicine | Admitting: Family

## 2020-03-17 VITALS — BP 121/86 | HR 91 | Resp 16 | Wt 264.4 lb

## 2020-03-17 DIAGNOSIS — M542 Cervicalgia: Secondary | ICD-10-CM

## 2020-03-17 DIAGNOSIS — I10 Essential (primary) hypertension: Secondary | ICD-10-CM

## 2020-03-17 MED ORDER — NAPROXEN 500 MG PO TABS: 500.0000 mg | ORAL_TABLET | Freq: Two times a day (BID) | ORAL | 0 refills | Status: DC

## 2020-03-17 MED FILL — NAPROXEN 500 MG TABLET: 500 | 30 days supply | Qty: 60 | Fill #0

## 2020-03-17 NOTE — Progress Notes (Signed)
Patient ID: Donald French, male    DOB: 1965/10/24  MRN: [id number]  CC: Hypertension  Subjective: Donald French is a 54 y.o. male with history of essential hypertension and left leg DVT who presents for hypertension follow-up.   1. HYPERTENSION FOLLOW-UP: 12/13/2019: Visit with Dr. Laural Benes. DBP mildly above goal off med. Leave patient off of medication and observe.  03/17/2020: Currently taking: not prescribed any medication Adherence with salt restriction: [x]  Yes    []  No Exercise: Yes [x]  No []  Home Monitoring?: []  Yes    [x]  No Smoking []  Yes [x]  No SOB? []  Yes    [x]  No Chest Pain?: []  Yes    [x]  No Leg swelling?: []  Yes    [x]  No Headaches?: []  Yes    [x]  No Dizziness? []  Yes    [x]  No Comments:   2. NECK PAIN: Location:  Lower part of neck  Onset: 2 weeks ago  Description: sharp  Symptoms Back Pain:  denies  Numbness/tingling: denies  Weakness: denies Overuse: yes, especially while at work Trauma:  denies   Red Flags Fever: denies  Bowel/bladder incontinence: denies Denies swelling, bulging, knots, and muscle spasms.  Patient Active Problem List   Diagnosis Date Noted  . Prediabetes 12/15/2019  . Hyperlipemia 12/15/2019  . COVID-19 virus vaccination declined 12/13/2019  . Class 1 obesity due to excess calories without serious comorbidity with body mass index (BMI) of 32.0 to 32.9 in adult 12/13/2019  . Asymptomatic varicose veins of left lower extremity 12/13/2019  . Left leg DVT (HCC) 09/25/2014  . Family history of prostate cancer 04/11/2014  . Hematuria 04/11/2014  . Essential hypertension 04/11/2014  . URI (upper respiratory infection) 04/10/2014     Current Outpatient Medications on File Prior to Visit  Medication Sig Dispense Refill  . amLODipine (NORVASC) 2.5 MG tablet TAKE 1 TABLET BY MOUTH ONCE DAILY (Patient not taking: Reported on 12/13/2019) 90 tablet 1   No current facility-administered medications on file prior to visit.    No  Known Allergies  Social History   Socioeconomic History  . Marital status: Single    Spouse name: Not on file  . Number of children: 0  . Years of education: Not on file  . Highest education level: Bachelor's degree (e.g., BA, AB, BS)  Occupational History  . Occupation: light industrial  Tobacco Use  . Smoking status: Never Smoker  . Smokeless tobacco: Never Used  Vaping Use  . Vaping Use: Never used  Substance and Sexual Activity  . Alcohol use: Not Currently  . Drug use: No  . Sexual activity: Not on file  Other Topics Concern  . Not on file  Social History Narrative  . Not on file   Social Determinants of Health   Financial Resource Strain:   . Difficulty of Paying Living Expenses: Not on file  Food Insecurity:   . Worried About in the Last Year: Not on file  . Ran Out of Food in the Last Year: Not on file  Transportation Needs:   . Lack of Transportation (Medical): Not on file  . Lack of Transportation (Non-Medical): Not on file  Physical Activity:   . Days of Exercise per Week: Not on file  . Minutes of Exercise per Session: Not on file  Stress:   . Feeling of Stress : Not on file  Social Connections:   . Frequency of Communication with Friends and Family: Not on file  . Frequency  of Social Gatherings with Friends and Family: Not on file  . Attends Religious Services: Not on file  . Active Member of Clubs or Organizations: Not on file  . Attends Banker Meetings: Not on file  . Marital Status: Not on file  Intimate Partner Violence:   . Fear of Current or Ex-Partner: Not on file  . Emotionally Abused: Not on file  . Physically Abused: Not on file  . Sexually Abused: Not on file    Family History  Problem Relation Age of Onset  . Hypertension Mother   . Hypertension Father   . Diabetes Father   . Cancer Paternal Grandmother        lung cancer    Past Surgical History:  Procedure Laterality Date  . SHOULDER SURGERY       ROS: Review of Systems Negative except as stated above  PHYSICAL EXAM: BP 121/86   Pulse 91   Resp 16   Wt 264 lb 6.4 oz (119.9 kg)   SpO2 98%   BMI 32.18 kg/m   Physical Exam General appearance - alert, well appearing, and in no distress and oriented to person, place, and time Mental status - alert, oriented to person, place, and time, normal mood, behavior, speech, dress, motor activity, and thought processes Neck - supple, no significant adenopathy Lymphatics - no palpable lymphadenopathy, no hepatosplenomegaly Chest - clear to auscultation, no wheezes, rales or rhonchi, symmetric air entry, no tachypnea, retractions or cyanosis Heart - normal rate, regular rhythm, normal S1, S2, no murmurs, rubs, clicks or gallops Neurological - alert, oriented, normal speech, no focal findings or movement disorder noted  ASSESSMENT AND PLAN: 1. Essential hypertension: - Previously not prescribed medication as blood pressure was close to goal. - Blood pressure at goal today.  - Last CMP obtained 12/13/2019. - Last CBC obtained 12/13/2019. - Counseled on blood pressure goal of less than 130/80, low-sodium, DASH diet, medication compliance, 150 minutes of moderate intensity exercise per week as tolerated. Discussed medication compliance, adverse effects. - Follow-up with primary physician in 3 months or sooner if needed.  2. 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  - Discussed proper body mechanics with patient such as lifting and moving with his legs instead of lifting with his back which can put strain on his neck. - Naproxen as prescribed for neck pain.  - Follow-up with primary physician in 4 to 6 weeks or sooner if needed. - naproxen (NAPROSYN) 500 MG tablet; Take 1 tablet (500 mg total) by mouth 2 (two) times daily with a meal.  Dispense: 60 tablet; Refill: 0  Patient was  given the opportunity to ask questions.  Patient verbalized understanding of the plan and was able to repeat key elements of the plan. Patient was given clear instructions to go to Emergency Department or return to medical center if symptoms don't improve, worsen, or new problems develop.The patient verbalized understanding.   Requested Prescriptions   Signed Prescriptions Disp Refills  . naproxen (NAPROSYN) 500 MG tablet 60 tablet 0    Sig: Take 1 tablet (500 mg total) by mouth 2 (two) times daily with a meal.    Return in about 3 months (around 06/17/2020) for Dr. Laural Benes.  Rema Fendt, NP

## 2020-03-17 NOTE — Patient Instructions (Addendum)
Healthy diet and exercise for blood pressure and follow-up in 3 months or sooner if needed. Naproxen for neck pain and follow-up in 4 to 6 weeks if not better.  Hypertension, Adult Hypertension is another name for high blood pressure. High blood pressure forces your heart to work harder to pump blood. This can cause problems over time. There are two numbers in a blood pressure reading. There is a top number (systolic) over a bottom number (diastolic). It is best to have a blood pressure that is below 120/80. Healthy choices can help lower your blood pressure, or you may need medicine to help lower it. What are the causes? The cause of this condition is not known. Some conditions may be related to high blood pressure. What increases the risk?  Smoking.  Having type 2 diabetes mellitus, high cholesterol, or both.  Not getting enough exercise or physical activity.  Being overweight.  Having too much fat, sugar, calories, or salt (sodium) in your diet.  Drinking too much alcohol.  Having long-term (chronic) kidney disease.  Having a family history of high blood pressure.  Age. Risk increases with age.  Race. You may be at higher risk if you are African American.  Gender. Men are at higher risk than women before age 31. After age 57, women are at higher risk than men.  Having obstructive sleep apnea.  Stress. What are the signs or symptoms?  High blood pressure may not cause symptoms. Very high blood pressure (hypertensive crisis) may cause: ? Headache. ? Feelings of worry or nervousness (anxiety). ? Shortness of breath. ? Nosebleed. ? A feeling of being sick to your stomach (nausea). ? Throwing up (vomiting). ? Changes in how you see. ? Very bad chest pain. ? Seizures. How is this treated?  This condition is treated by making healthy lifestyle changes, such as: ? Eating healthy foods. ? Exercising more. ? Drinking less alcohol.  Your health care provider may prescribe  medicine if lifestyle changes are not enough to get your blood pressure under control, and if: ? Your top number is above 130. ? Your bottom number is above 80.  Your personal target blood pressure may vary. Follow these instructions at home: Eating and drinking   If told, follow the DASH eating plan. To follow this plan: ? Fill one half of your plate at each meal with fruits and vegetables. ? Fill one fourth of your plate at each meal with whole grains. Whole grains include whole-wheat pasta, brown rice, and whole-grain bread. ? Eat or drink low-fat dairy products, such as skim milk or low-fat yogurt. ? Fill one fourth of your plate at each meal with low-fat (lean) proteins. Low-fat proteins include fish, chicken without skin, eggs, beans, and tofu. ? Avoid fatty meat, cured and processed meat, or chicken with skin. ? Avoid pre-made or processed food.  Eat less than 1,500 mg of salt each day.  Do not drink alcohol if: ? Your doctor tells you not to drink. ? You are pregnant, may be pregnant, or are planning to become pregnant.  If you drink alcohol: ? Limit how much you use to:  0-1 drink a day for women.  0-2 drinks a day for men. ? Be aware of how much alcohol is in your drink. In the [place] one drink equals one 12 oz bottle of beer (355 mL), one 5 oz glass of wine (148 mL), or one 1 oz glass of hard liquor (44 mL). Lifestyle   Work with  your doctor to stay at a healthy weight or to lose weight. Ask your doctor what the best weight is for you.  Get at least 30 minutes of exercise most days of the week. This may include walking, swimming, or biking.  Get at least 30 minutes of exercise that strengthens your muscles (resistance exercise) at least 3 days a week. This may include lifting weights or doing Pilates.  Do not use any products that contain nicotine or tobacco, such as cigarettes, e-cigarettes, and chewing tobacco. If you need help quitting, ask your doctor.  Check  your blood pressure at home as told by your doctor.  Keep all follow-up visits as told by your doctor. This is important. Medicines  Take over-the-counter and prescription medicines only as told by your doctor. Follow directions carefully.  Do not skip doses of blood pressure medicine. The medicine does not work as well if you skip doses. Skipping doses also puts you at risk for problems.  Ask your doctor about side effects or reactions to medicines that you should watch for. Contact a doctor if you:  Think you are having a reaction to the medicine you are taking.  Have headaches that keep coming back (recurring).  Feel dizzy.  Have swelling in your ankles.  Have trouble with your vision. Get help right away if you:  Get a very bad headache.  Start to feel mixed up (confused).  Feel weak or numb.  Feel faint.  Have very bad pain in your: ? Chest. ? Belly (abdomen).  Throw up more than once.  Have trouble breathing. Summary  Hypertension is another name for high blood pressure.  High blood pressure forces your heart to work harder to pump blood.  For most people, a normal blood pressure is less than 120/80.  Making healthy choices can help lower blood pressure. If your blood pressure does not get lower with healthy choices, you may need to take medicine. This information is not intended to replace advice given to you by your health care provider. Make sure you discuss any questions you have with your health care provider. Document Revised: 01/24/2018 Document Reviewed: 01/24/2018 Elsevier Patient Education  2020 [employer].

## 2020-06-18 ENCOUNTER — Ambulatory Visit: Payer: Self-pay | Attending: Internal Medicine | Admitting: Internal Medicine

## 2020-06-18 ENCOUNTER — Other Ambulatory Visit: Payer: Self-pay

## 2020-06-18 ENCOUNTER — Telehealth: Payer: Self-pay | Admitting: Internal Medicine

## 2020-06-18 ENCOUNTER — Encounter: Payer: Self-pay | Admitting: Internal Medicine

## 2020-06-18 VITALS — BP 120/90 | HR 71 | Temp 98.7°F | Resp 16 | Wt 272.6 lb

## 2020-06-18 DIAGNOSIS — I10 Essential (primary) hypertension: Secondary | ICD-10-CM

## 2020-06-18 DIAGNOSIS — H6123 Impacted cerumen, bilateral: Secondary | ICD-10-CM

## 2020-06-18 DIAGNOSIS — Z2821 Immunization not carried out because of patient refusal: Secondary | ICD-10-CM

## 2020-06-18 DIAGNOSIS — R058 Other specified cough: Secondary | ICD-10-CM

## 2020-06-18 NOTE — Progress Notes (Signed)
Patient ID: Donald French, male    DOB: 11-Jun-1965  MRN: [id number]  CC: Hypertension   Subjective: Donald French is a 55 y.o. male who presents for chronic ds management His concerns today include:  Hx of HTN, DVT, LLE, obesity, varicose veins  C/o cough x few days. Dry cough; no wheezing or rittle No fever, SOB, running nose, sore throat or congestion, loss of taste or smell.  No sick contact.  Not vaccinated against COVID and has no plans to get the vaccine.  Also c/o tingling in RT ear.  Does not itch. No drainage.  No decrease hearing  HTN:  Observing off med.  He tries to limit salt in the foods.  Patient Active Problem List   Diagnosis Date Noted  . Prediabetes 12/15/2019  . Hyperlipemia 12/15/2019  . COVID-19 virus vaccination declined 12/13/2019  . Class 1 obesity due to excess calories without serious comorbidity with body mass index (BMI) of 32.0 to 32.9 in adult 12/13/2019  . Asymptomatic varicose veins of left lower extremity 12/13/2019  . Left leg DVT (HCC) 09/25/2014  . Family history of prostate cancer 04/11/2014  . Hematuria 04/11/2014  . Essential hypertension 04/11/2014  . URI (upper respiratory infection) 04/10/2014     Current Outpatient Medications on File Prior to Visit  Medication Sig Dispense Refill  . amLODipine (NORVASC) 2.5 MG tablet TAKE 1 TABLET BY MOUTH ONCE DAILY (Patient not taking: Reported on 12/13/2019) 90 tablet 1  . naproxen (NAPROSYN) 500 MG tablet Take 1 tablet (500 mg total) by mouth 2 (two) times daily with a meal. (Patient not taking: Reported on 06/18/2020) 60 tablet 0   No current facility-administered medications on file prior to visit.    No Known Allergies  Social History   Socioeconomic History  . Marital status: Single    Spouse name: Not on file  . Number of children: 0  . Years of education: Not on file  . Highest education level: Bachelor's degree (e.g., BA, AB, BS)  Occupational History  . Occupation: light  industrial  Tobacco Use  . Smoking status: Never Smoker  . Smokeless tobacco: Never Used  Vaping Use  . Vaping Use: Never used  Substance and Sexual Activity  . Alcohol use: Not Currently  . Drug use: No  . Sexual activity: Not on file  Other Topics Concern  . Not on file  Social History Narrative  . Not on file   Social Determinants of Health   Financial Resource Strain: Not on file  Food Insecurity: Not on file  Transportation Needs: Not on file  Physical Activity: Not on file  Stress: Not on file  Social Connections: Not on file  Intimate Partner Violence: Not on file    Family History  Problem Relation Age of Onset  . Hypertension Mother   . Hypertension Father   . Diabetes Father   . Cancer Paternal Grandmother        lung cancer    Past Surgical History:  Procedure Laterality Date  . SHOULDER SURGERY      ROS: Review of Systems Negative except as stated above  PHYSICAL EXAM: BP 120/90   Pulse 71   Temp 98.7 F (37.1 C)   Resp 16   Wt 272 lb 9.6 oz (123.7 kg)   SpO2 98%   BMI 33.18 kg/m   Physical Exam  General appearance - alert, well appearing, middle-aged African-American male and in no distress Mental status - normal mood, behavior,  speech, dress, motor activity, and thought processes Ears -moderate amount of soft wax buildup in both ear canals obscuring view of the eardrum.   Nose - normal and patent, no erythema, discharge or polyps Neck - supple, no significant adenopathy Chest - clear to auscultation, no wheezes, rales or rhonchi, symmetric air entry Heart - normal rate, regular rhythm, normal S1, S2, no murmurs, rubs, clicks or gallops  CMP Latest Ref Rng & Units 12/13/2019 11/03/2014 08/25/2014  Glucose 65 - 99 mg/dL 82 76 130(Q)  BUN 6 - 24 mg/dL 9 15 7   Creatinine 0.76 - 1.27 mg/dL ) 6.57(Q) 4.69(G  Sodium 134 - 144 mmol/L 142 140 139  Potassium 3.5 - 5.2 mmol/L 5.1 4.4 4.6  Chloride 96 - 106 mmol/L 103 106 107  CO2 20 - 29  mmol/L 28 25 24   Calcium 8.7 - 10.2 mg/dL 8.8 9.0 8.9  Total Protein 6.0 - 8.5 g/dL 2.95) 5.6(L) 6.1  Total Bilirubin 0.0 - 1.2 mg/dL 0.4 0.3 0.7  Alkaline Phos 48 - 121 IU/L 45(L) 33(L) 44  AST 0 - 40 IU/L 29 21 21   ALT 0 - 44 IU/L 35 17 22   Lipid Panel     Component Value Date/Time   CHOL 196 12/13/2019 0935   TRIG 65 12/13/2019 0935   HDL 52 12/13/2019 0935   CHOLHDL 3.8 12/13/2019 0935   CHOLHDL 4.7 04/11/2014 1709   VLDL 56 (H) 04/11/2014 1709   LDLCALC 132 (H) 12/13/2019 0935    CBC    Component Value Date/Time   WBC 4.8 12/13/2019 0935   WBC 4.3 11/03/2014 1017   RBC 5.19 12/13/2019 0935   RBC 5.15 11/03/2014 1017   HGB 15.5 12/13/2019 0935   HCT 47.2 12/13/2019 0935   PLT 143 (L) 12/13/2019 0935   MCV 91 12/13/2019 0935   MCH 29.9 12/13/2019 0935   MCH 30.1 11/03/2014 1017   MCHC 32.8 12/13/2019 0935   MCHC 33.5 11/03/2014 1017   RDW 12.7 12/13/2019 0935   LYMPHSABS 1.8 11/03/2014 1017   MONOABS 0.3 11/03/2014 1017   EOSABS 0.0 11/03/2014 1017   BASOSABS 0.0 11/03/2014 1017    ASSESSMENT AND PLAN:  1. Bilateral impacted cerumen Recommend using some wax softener which she can purchase over-the-counter.  He can then follow-up next week as a nurse only visit to have his ears flushed.  2. Dry cough Can use over-the-counter Mucinex or Robitussin.  3. Essential hypertension Diastolic blood pressure elevated.  Patient not interested in getting back on medication.  He states he will get it down.  He will follow-up with the clinical pharmacist in 1 month for repeat blood pressure check.  If still elevated he will be agreeable to restarting amlodipine.  4. COVID-19 vaccination declined   Patient was given the opportunity to ask questions.  Patient verbalized understanding of the plan and was able to repeat key elements of the plan.   No orders of the defined types were placed in this encounter.    Requested Prescriptions    No prescriptions requested or  ordered in this encounter    Return in about 5 months (around 11/16/2020) for Give appt with 01/03/2015 1 mth for BP check.  Give nurse appt next wk to get ears flushed.  01/03/2015, MD, FACP

## 2020-06-18 NOTE — Progress Notes (Signed)
Pt states he has a dry cough with phlegm  Pt states his right ear has been tingling for 3 weeks

## 2020-06-18 NOTE — Telephone Encounter (Signed)
Patient is calling to schedule appt with Franky Macho next week for a BP check. Please advise CB- [phone]

## 2020-06-18 NOTE — Patient Instructions (Signed)
You can purchase Mucinex over-the-counter and use it as needed for cough.  Purchase wax softener over-the-counter and use it once a day in both ears for the next 3 days.  You can come back next week as a nurse only visit to have your ears flushed.  Continue to limit salt in the foods.

## 2020-06-26 ENCOUNTER — Ambulatory Visit: Payer: Medicaid Other

## 2020-07-20 ENCOUNTER — Ambulatory Visit: Payer: Medicaid Other | Admitting: Pharmacist

## 2020-08-24 ENCOUNTER — Ambulatory Visit: Payer: Self-pay | Attending: Internal Medicine | Admitting: Pharmacist

## 2020-08-24 ENCOUNTER — Encounter: Payer: Self-pay | Admitting: Pharmacist

## 2020-08-24 ENCOUNTER — Other Ambulatory Visit: Payer: Self-pay

## 2020-08-24 VITALS — BP 116/84

## 2020-08-24 DIAGNOSIS — I10 Essential (primary) hypertension: Secondary | ICD-10-CM

## 2020-08-24 NOTE — Progress Notes (Signed)
   S:    PCP: Dr. Laural Benes   Patient arrives in good spirits. Presents to the clinic for hypertension evaluation, counseling, and management. Patient was referred and last seen by Primary Care Provider on 06/18/2020. BP was 120/90. No medications were started at that visit. Pt agreed to return for a BP check today.   Medication adherence: he is not currently taking BP medicaiton.  Current BP Medications include:  None   Antihypertensives tried in the past include: amlodipine, HCTZ  Dietary habits include: compliant with salt restriction; admits to consuming sodas regularly but only drinks caffeine free Exercise habits include: walks; performs sit-ups 1-2x/week Family / Social history:  - FHx: HTN, DM - Tobacco: never smoker  - Alcohol: denies use   O:  Vitals:   08/24/20 1530  BP: 116/84   Home BP readings: none  Last 3 Office BP readings: BP Readings from Last 3 Encounters:  08/24/20 116/84  06/18/20 120/90  03/17/20 121/86    BMET    Component Value Date/Time   NA 142 12/13/2019 0935   K 5.1 12/13/2019 0935   CL 103 12/13/2019 0935   CO2 28 12/13/2019 0935   GLUCOSE 82 12/13/2019 0935   GLUCOSE 76 11/03/2014 1017   BUN 9 12/13/2019 0935   CREATININE 1.31 (H) 12/13/2019 0935   CREATININE 1.73 (H) 11/03/2014 1017   CALCIUM 8.8 12/13/2019 0935   GFRNONAA 62 12/13/2019 0935   GFRNONAA 46 (L) 11/03/2014 1017   GFRAA 71 12/13/2019 0935   GFRAA 53 (L) 11/03/2014 1017   Renal function: CrCl cannot be calculated (Patient's most recent lab result is older than the maximum 21 days allowed.).  Clinical ASCVD: No  The 10-year ASCVD risk score Denman George DC Jr., et al., 2013) is: 5.3%   Values used to calculate the score:     Age: 55 years     Sex: Male     Is Non-Hispanic African American: Yes     Diabetic: No     Tobacco smoker: No     Systolic Blood Pressure: 116 mmHg     Is BP treated: No     HDL Cholesterol: 52 mg/dL     Total Cholesterol: 196  mg/dL  A/P: Hypertension longstanding currently close to goal. BP Goal = < 130/80 mmHg. Medication adherence: he is not currently taking meds. Will not initiate therapy today.   -No medications today.    -Counseled on lifestyle modifications for blood pressure control including reduced dietary sodium, increased exercise, adequate sleep.  Results reviewed and written information provided. Total time in face-to-face counseling 30 minutes.   F/U Clinic Visit in 11/06/2020.   Butch Penny, PharmD, P[place]y [place], CPP Clinical Pharmacist [facility] [phone]

## 2020-08-26 ENCOUNTER — Telehealth: Payer: Self-pay | Admitting: Internal Medicine

## 2020-08-26 DIAGNOSIS — Z1211 Encounter for screening for malignant neoplasm of colon: Secondary | ICD-10-CM

## 2020-08-26 NOTE — Telephone Encounter (Signed)
-----   Message from Drucilla Chalet, RPH-CPP sent at 08/24/2020  3:32 PM EDT ----- Dr Laural Benes,   Pt is interested in a colonoscopy but states his insurance will only cover in the [employer]. Are you able to submit a referral with a request for it to be done at a [facility] facility?   Thank you,   Butch Penny, PharmD, BCACP, CPP Clinical Pharmacist [facility] [phone]

## 2020-09-24 ENCOUNTER — Ambulatory Visit: Payer: Medicaid Other

## 2020-11-06 ENCOUNTER — Ambulatory Visit: Payer: Medicaid Other | Admitting: Internal Medicine

## 2021-01-05 ENCOUNTER — Other Ambulatory Visit: Payer: Self-pay

## 2021-01-05 ENCOUNTER — Encounter: Payer: Self-pay | Admitting: Internal Medicine

## 2021-01-05 ENCOUNTER — Ambulatory Visit: Payer: Self-pay | Attending: Internal Medicine | Admitting: Internal Medicine

## 2021-01-05 VITALS — BP 118/87 | HR 64 | Resp 16 | Wt 270.0 lb

## 2021-01-05 DIAGNOSIS — E6609 Other obesity due to excess calories: Secondary | ICD-10-CM

## 2021-01-05 DIAGNOSIS — I10 Essential (primary) hypertension: Secondary | ICD-10-CM

## 2021-01-05 DIAGNOSIS — Z125 Encounter for screening for malignant neoplasm of prostate: Secondary | ICD-10-CM

## 2021-01-05 DIAGNOSIS — H6123 Impacted cerumen, bilateral: Secondary | ICD-10-CM

## 2021-01-05 DIAGNOSIS — R0981 Nasal congestion: Secondary | ICD-10-CM

## 2021-01-05 DIAGNOSIS — Z6832 Body mass index (BMI) 32.0-32.9, adult: Secondary | ICD-10-CM

## 2021-01-05 MED ORDER — LORATADINE 10 MG PO TABS
10.0000 mg | ORAL_TABLET | Freq: Every day | ORAL | 0 refills | Status: DC
Start: 2021-01-05 — End: 2022-01-18
  Filled 2021-01-05: qty 30, 30d supply, fill #0

## 2021-01-05 MED ORDER — FLUTICASONE PROPIONATE 50 MCG/ACT NA SUSP
1.0000 | Freq: Every day | NASAL | 0 refills | Status: DC | PRN
Start: 2021-01-05 — End: 2022-01-18
  Filled 2021-01-05: qty 16, 60d supply, fill #0

## 2021-01-05 NOTE — Progress Notes (Signed)
Pt is requesting to have PSA level checked  Pt states he has been having frequent urination

## 2021-01-05 NOTE — Patient Instructions (Signed)

## 2021-01-05 NOTE — Progress Notes (Signed)
Patient ID: RYKIN ROUTE, male    DOB: Nov 15, 1965  MRN: [id number]  CC: chronic ds management  Subjective: Donald French is a 55 y.o. male who presents for chronic ds management. Lafonda Mosses, his, fianc is with him His concerns today include:  Hx of HTN, DVT, LLE, obesity, varicose veins  C/o sinus congestion and post nasal drip x 2wks. No itchy throat or eyes.  Some itching in the ears No fever or cough Took some Sudafed and an antihistamine.  HTN: being observed off meds Saw clinical pharmacist in March He checks BP 2 x a wk.  SBP less 130s and DBP always in upper 80s. He limits salt intake He works out for 1 hr daily.  Trying to get wgh down. Only eats fish for meat.  Drinks sodas and water.   Reports decrease hearing for a while.  Fiance used Debrox in his ear and tried flushing several days ago.  A lot of wax came out per her report  HM:  had normal c-scope last mth at [facility].  Declines shingles, COVID-19 and tdap vaccines.  Request prostate CA screen Patient Active Problem List   Diagnosis Date Noted   Prediabetes 12/15/2019   Hyperlipemia 12/15/2019   COVID-19 virus vaccination declined 12/13/2019   Class 1 obesity due to excess calories without serious comorbidity with body mass index (BMI) of 32.0 to 32.9 in adult 12/13/2019   Asymptomatic varicose veins of left lower extremity 12/13/2019   Left leg DVT (HCC) 09/25/2014   Family history of prostate cancer 04/11/2014   Hematuria 04/11/2014   Essential hypertension 04/11/2014   URI (upper respiratory infection) 04/10/2014     No current outpatient medications on file prior to visit.   No current facility-administered medications on file prior to visit.    No Known Allergies  Social History   Socioeconomic History   Marital status: Single    Spouse name: Not on file   Number of children: 0   Years of education: Not on file   Highest education level: Bachelor's degree (e.g., BA, AB, BS)  Occupational History    Occupation: light industrial  Tobacco Use   Smoking status: Never   Smokeless tobacco: Never  Vaping Use   Vaping Use: Never used  Substance and Sexual Activity   Alcohol use: Not Currently   Drug use: No   Sexual activity: Not on file  Other Topics Concern   Not on file  Social History Narrative   Not on file   Social Determinants of Health   Financial Resource Strain: Not on file  Food Insecurity: Not on file  Transportation Needs: Not on file  Physical Activity: Not on file  Stress: Not on file  Social Connections: Not on file  Intimate Partner Violence: Not on file    Family History  Problem Relation Age of Onset   Hypertension Mother    Hypertension Father    Diabetes Father    Cancer Paternal Grandmother        lung cancer    Past Surgical History:  Procedure Laterality Date   SHOULDER SURGERY      ROS: Review of Systems Negative except as stated above  PHYSICAL EXAM: BP 118/87   Pulse 64   Resp 16   Wt 270 lb (122.5 kg)   SpO2 99%   BMI 32.87 kg/m   Wt Readings from Last 3 Encounters:  01/05/21 270 lb (122.5 kg)  06/18/20 272 lb 9.6 oz (123.7 kg)  03/17/20 264 lb 6.4 oz (119.9 kg)  Repeat BP 124/90  Physical Exam General appearance - alert, well appearing, and in no distress Mental status - normal mood, behavior, speech, dress, motor activity, and thought processes Ears - BL impacted cerumen Nose - mild enlargement nasal turbinates Mouth - mucous membranes moist, pharynx normal without lesions Chest - clear to auscultation, no wheezes, rales or rhonchi, symmetric air entry Heart - normal rate, regular rhythm, normal S1, S2, no murmurs, rubs, clicks or gallops Extremities - peripheral pulses normal, no pedal edema, no clubbing or cyanosis  CMP Latest Ref Rng & Units 12/13/2019 11/03/2014 08/25/2014  Glucose 65 - 99 mg/dL 82 76 924(Q)  BUN 6 - 24 mg/dL 9 15 7   Creatinine 0.76 - 1.27 mg/dL ) 6.83(M) 1.96(Q  Sodium 134 - 144 mmol/L 142 140  139  Potassium 3.5 - 5.2 mmol/L 5.1 4.4 4.6  Chloride 96 - 106 mmol/L 103 106 107  CO2 20 - 29 mmol/L 28 25 24   Calcium 8.7 - 10.2 mg/dL 8.8 9.0 8.9  Total Protein 6.0 - 8.5 g/dL 2.29) 5.6(L) 6.1  Total Bilirubin 0.0 - 1.2 mg/dL 0.4 0.3 0.7  Alkaline Phos 48 - 121 IU/L 45(L) 33(L) 44  AST 0 - 40 IU/L 29 21 21   ALT 0 - 44 IU/L 35 17 22   Lipid Panel     Component Value Date/Time   CHOL 196 12/13/2019 0935   TRIG 65 12/13/2019 0935   HDL 52 12/13/2019 0935   CHOLHDL 3.8 12/13/2019 0935   CHOLHDL 4.7 04/11/2014 1709   VLDL 56 (H) 04/11/2014 1709   LDLCALC 132 (H) 12/13/2019 0935    CBC    Component Value Date/Time   WBC 4.8 12/13/2019 0935   WBC 4.3 11/03/2014 1017   RBC 5.19 12/13/2019 0935   RBC 5.15 11/03/2014 1017   HGB 15.5 12/13/2019 0935   HCT 47.2 12/13/2019 0935   PLT 143 (L) 12/13/2019 0935   MCV 91 12/13/2019 0935   MCH 29.9 12/13/2019 0935   MCH 30.1 11/03/2014 1017   MCHC 32.8 12/13/2019 0935   MCHC 33.5 11/03/2014 1017   RDW 12.7 12/13/2019 0935   LYMPHSABS 1.8 11/03/2014 1017   MONOABS 0.3 11/03/2014 1017   EOSABS 0.0 11/03/2014 1017   BASOSABS 0.0 11/03/2014 1017    ASSESSMENT AND PLAN: 1. Essential hypertension Not at goal with reported home readings and today.  Discussed risks associated with uncontrolled blood pressure.  I recommend starting BP lowering med.  Patient not wanting to be put on medication. - CBC - Comprehensive metabolic panel - Lipid panel  2. Class 1 obesity due to excess calories without serious comorbidity with body mass index (BMI) of 32.0 to 32.9 in adult Dietary counseling given.  Advised to eliminate sugary drinks from the diet.  Advised to eat more lean white meat instead of red meat, cut back on portion sizes of white carbohydrates and incorporate fresh fruits and vegetables into the diet.  Print info given.  Continue regular exercise.  3. Sinus congestion - fluticasone (FLONASE) 50 MCG/ACT nasal spray; Place 1 spray  into both nostrils daily as needed for allergies or rhinitis.  Dispense: 16 g; Refill: 0 - loratadine (CLARITIN) 10 MG tablet; Take 1 tablet (10 mg total) by mouth daily.  Dispense: 30 tablet; Refill: 0  4. Bilateral impacted cerumen My CMA successfully  flush ears today.  5. Prostate cancer screening - PSA   Patient was given the opportunity to ask questions.  Patient verbalized understanding of the plan and was able to repeat key elements of the plan.   No orders of the defined types were placed in this encounter.    Requested Prescriptions    No prescriptions requested or ordered in this encounter    No follow-ups on file.  Jonah Blue, MD, FACP

## 2021-01-06 LAB — COMPREHENSIVE METABOLIC PANEL
ALT: 19 IU/L (ref 0–44)
AST: 29 IU/L (ref 0–40)
Albumin/Globulin Ratio: 1.6 (ref 1.2–2.2)
Albumin: 4.1 g/dL (ref 3.8–4.9)
Alkaline Phosphatase: 51 IU/L (ref 44–121)
BUN/Creatinine Ratio: 3 — ABNORMAL LOW (ref 9–20)
BUN: 4 mg/dL — ABNORMAL LOW (ref 6–24)
Bilirubin Total: 0.6 mg/dL (ref 0.0–1.2)
CO2: 23 mmol/L (ref 20–29)
Calcium: 8.9 mg/dL (ref 8.7–10.2)
Chloride: 103 mmol/L (ref 96–106)
Creatinine, Ser: 1.25 mg/dL (ref 0.76–1.27)
Globulin, Total: 2.6 g/dL (ref 1.5–4.5)
Glucose: 87 mg/dL (ref 65–99)
Potassium: 4.7 mmol/L (ref 3.5–5.2)
Sodium: 141 mmol/L (ref 134–144)
Total Protein: 6.7 g/dL (ref 6.0–8.5)
eGFR: 68 mL/min/{1.73_m2} (ref >59)

## 2021-01-06 LAB — CBC
Hematocrit: 48.2 % (ref 37.5–51.0)
Hemoglobin: 15.7 g/dL (ref 13.0–17.7)
MCH: 29.3 pg (ref 26.6–33.0)
MCHC: 32.6 g/dL (ref 31.5–35.7)
MCV: 90 fL (ref 79–97)
Platelets: 189 10*3/uL (ref 150–450)
RBC: 5.35 x10E6/uL (ref 4.14–5.80)
RDW: 14.1 % (ref 11.6–15.4)
WBC: 5.3 10*3/uL (ref 3.4–10.8)

## 2021-01-06 LAB — LIPID PANEL
Chol/HDL Ratio: 4.4 ratio (ref 0.0–5.0)
Cholesterol, Total: 213 mg/dL — ABNORMAL HIGH (ref 100–199)
HDL: 48 mg/dL (ref >39)
LDL Chol Calc (NIH): 145 mg/dL — ABNORMAL HIGH (ref 0–99)
Triglycerides: 114 mg/dL (ref 0–149)
VLDL Cholesterol Cal: 20 mg/dL (ref 5–40)

## 2021-01-06 LAB — PSA: Prostate Specific Ag, Serum: 3.5 ng/mL (ref 0.0–4.0)

## 2021-01-06 NOTE — Progress Notes (Signed)
Let patient know that his blood cell counts including red blood cells, white blood cells and platelet counts are normal.  Blood sugar level normal.  Kidney function is okay.  Liver function test normal.  Cholesterol levels elevated.  LDL cholesterol, which is the bad cholesterol that can increase risk for heart attack and strokes, is 145 with goal being less than 100.  Healthy eating habits and regular exercise will help to lower cholesterol.  PSA level which is the screening test for prostate cancer is within normal range. The rest of this is for my information. The 10-year ASCVD risk score Denman George DC Montez Hageman., et al., 2013) is: 5.7%   Values used to calculate the score:     Age: 55 years     Sex: Male     Is Non-Hispanic African American: Yes     Diabetic: No     Tobacco smoker: No     Systolic Blood Pressure: 118 mmHg     Is BP treated: No     HDL Cholesterol: 48 mg/dL     Total Cholesterol: 213 mg/dL

## 2021-01-13 ENCOUNTER — Telehealth: Payer: Self-pay

## 2021-01-13 NOTE — Telephone Encounter (Signed)
-----   Message from Marcine Matar, MD sent at 01/06/2021 11:00 AM EDT ----- Let patient know that his blood cell counts including red blood cells, white blood cells and platelet counts are normal.  Blood sugar level normal.  Kidney function is okay.  Liver function test normal.  Cholesterol levels elevated.  LDL cholesterol, which is the bad cholesterol that can increase risk for heart attack and strokes, is 145 with goal being less than 100.  Healthy eating habits and regular exercise will help to lower cholesterol.  PSA level which is the screening test for prostate cancer is within normal range. The rest of this is for my information. The 10-year ASCVD risk score Denman George DC Montez Hageman., et al., 2013) is: 5.7%   Values used to calculate the score:     Age: 55 years     Sex: Male     Is Non-Hispanic African American: Yes     Diabetic: No     Tobacco smoker: No     Systolic Blood Pressure: 118 mmHg     Is BP treated: No     HDL Cholesterol: 48 mg/dL     Total Cholesterol: 213 mg/dL

## 2021-01-13 NOTE — Telephone Encounter (Signed)
Patient was called and a voicemail was left informing patient to return phone call for lab results. 

## 2021-04-03 DIAGNOSIS — I2694 Multiple subsegmental pulmonary emboli without acute cor pulmonale: Secondary | ICD-10-CM | POA: Insufficient documentation

## 2021-04-05 ENCOUNTER — Ambulatory Visit: Payer: Medicaid Other | Admitting: Physician Assistant

## 2021-04-05 DIAGNOSIS — I5020 Unspecified systolic (congestive) heart failure: Secondary | ICD-10-CM | POA: Insufficient documentation

## 2021-04-13 ENCOUNTER — Inpatient Hospital Stay: Payer: Medicaid Other | Admitting: Critical Care Medicine

## 2021-04-13 NOTE — Progress Notes (Deleted)
Established Patient Office Visit  Subjective:  Patient ID: Donald French, male    DOB: [birth year]  Age: 55 y.o. MRN: [id number]  CC: No chief complaint on file.   HPI Donald French presents for  Flu  Admit date: 04/02/2021 Discharge date:  Discharge Service: [place] Admitting Provider: Reginold Agent, MD Discharge Attending Physician: Rupert Stacks, MD Discharge to: Home Consults:  PCP: No PCP Per Patient  Discharge Diagnoses:  Principal Problem: Multiple subsegmental pulmonary emboli without acute cor pulmonale (CMS-HCC) Active Problems: Class 1 obesity due to excess calories without serious comorbidity with body mass index (BMI) of 32.0 to 32.9 in adult Essential hypertension Family history of prostate cancer Hyperlipemia Left leg DVT (CMS-HCC) Prediabetes HFrEF (heart failure with reduced ejection fraction) (CMS-HCC) Resolved Problems: * No resolved hospital problems. *  Hospital Course:   PCP Follow Up: _0  Multiple PE: Ensure he has access to both doses Xarelto (57m BID x21d & thereafter 266mevery day) _1  New diagnosis CHF with EF 35-40% on echocardiogram without signs/symptoms CHF. Started metoprolol 25qd inpatient.  - _2  Cardiology referral at discharge - _3  Hematology referral at discharge  ## PE  DVT: Patient with a history of what appears to be an unprovoked DVT in 2015 for which he only did 3 months of treatment now presenting with one week of leg pain found to have large DVT and multiple Pe's therefore started on heparin gtt. Reassuringly he remained hemodynamically stable and on RA. No obvious evidence of RH strain on ECHO therefore he was transitioned to Xarelto. Manufacturer assistance application completed this hospitalization and SW consulted for assistance with pharmacy assistance.  - [facility] for first 21d of Xarelto - then [employer] following 30d Xarelto - We discussed that with this being his second  clot, would recommend lifelong anticoagulation. Referred to Heme for follow up outpatient for further discussion regarding any need for possible genetic work up. Up to date on colon and prostate cancer screening.  ## HFrEF: New diagnosis of asymptomatic HFrEF this admission. ECHO shows Grade I DD and then EF mildly reduced to 35-40%. No family history of cardiac disease thus unlikely genetic. No valvuar disease seen on ECHO. Could certainly be ischemic but he is overall very active with plant based diet thus lower risk. Plan for Cardiology follow up outpatient for possible cardiac cath. Does not currently/formerly smoke, drink, or use drugs.  - LDL 131, A1c 5.7%, TSH 7.6,  - Iron panel normal for acute illness - Cards referral at DC  FYI: found these results in prior record: Results: 1. Antiphospholipid antibody panel: Normal 2. Factor V Leiden and prothrombin gene mutation: Normal 3. Serum homocysteine: 11 (Normal) 4. Protein C (167) and protein S (134) and antithrombin III Normal We could not identify any inherited or acquired hypercoagulable risk factors.  Procedures:  None  ___________________________________________________________________ Discharge Day Services:  Pt seen on the day of discharge and determined appropriate for discharge.  Day of Discharge Services:  Subjective:   Interval History: Doing well. Feels well. Good appetite. No SOB, CP, or leg pain.   ROS negative for fever, chills, night sweats, nausea, vomiting, diarrhea, constipation, chest pain, SOB, DOE, Abdominal pain, dysuria, rash, vision changes, headache, unilateral weakness, numbness or tingling  Objective:   Vital signs in last 24 hours: Temp: [36.5 C (97.7 F)-37 C (98.6 F)] 36.7 C (98.1 F) Heart Rate: [67-78] 67 Resp: [16-18] 18 BP: (105-133)/(71-90) 133/87 MAP (mmHg): [93-102]  100 SpO2: [95 %-99 %] 99 %  Past Medical History:  Diagnosis Date  . Hypertension     Past Surgical History:   Procedure Laterality Date  . SHOULDER SURGERY      Family History  Problem Relation Age of Onset  . Hypertension Mother   . Hypertension Father   . Diabetes Father   . Cancer Paternal Grandmother        lung cancer    Social History   Socioeconomic History  . Marital status: Single    Spouse name: Not on file  . Number of children: 0  . Years of education: Not on file  . Highest education level: Bachelor's degree (e.g., BA, AB, BS)  Occupational History  . Occupation: light industrial  Tobacco Use  . Smoking status: Never  . Smokeless tobacco: Never  Vaping Use  . Vaping Use: Never used  Substance and Sexual Activity  . Alcohol use: Not Currently  . Drug use: No  . Sexual activity: Not on file  Other Topics Concern  . Not on file  Social History Narrative  . Not on file   Social Determinants of Health   Financial Resource Strain: Not on file  Food Insecurity: Not on file  Transportation Needs: Not on file  Physical Activity: Not on file  Stress: Not on file  Social Connections: Not on file  Intimate Partner Violence: Not on file    Outpatient Medications Prior to Visit  Medication Sig Dispense Refill  . fluticasone (FLONASE) 50 MCG/ACT nasal spray Place 1 spray into both nostrils daily as needed for allergies or rhinitis. 16 g 0  . loratadine (CLARITIN) 10 MG tablet Take 1 tablet (10 mg total) by mouth daily. 30 tablet 0   No facility-administered medications prior to visit.    No Known Allergies  ROS Review of Systems    Objective:    Physical Exam  There were no vitals taken for this visit. Wt Readings from Last 3 Encounters:  01/05/21 270 lb (122.5 kg)  06/18/20 272 lb 9.6 oz (123.7 kg)  03/17/20 264 lb 6.4 oz (119.9 kg)     Health Maintenance Due  Topic Date Due  . Zoster Vaccines- Shingrix (1 of 2) Never done  . INFLUENZA VACCINE  Never done    There are no preventive care reminders to display for this patient.  Lab Results   Component Value Date   TSH 2.641 07/08/2014   Lab Results  Component Value Date   WBC 5.3 01/05/2021   HGB 15.7 01/05/2021   HCT 48.2 01/05/2021   MCV 90 01/05/2021   PLT 189 01/05/2021   Lab Results  Component Value Date   NA 141 01/05/2021   K 4.7 01/05/2021   CO2 23 01/05/2021   GLUCOSE 87 01/05/2021   BUN 4 (L) 01/05/2021   CREATININE 1.25 01/05/2021   BILITOT 0.6 01/05/2021   ALKPHOS 51 01/05/2021   AST 29 01/05/2021   ALT 19 01/05/2021   PROT 6.7 01/05/2021   ALBUMIN 4.1 01/05/2021   CALCIUM 8.9 01/05/2021   ANIONGAP 8 08/25/2014   EGFR 68 01/05/2021   Lab Results  Component Value Date   CHOL 213 (H) 01/05/2021   Lab Results  Component Value Date   HDL 48 01/05/2021   Lab Results  Component Value Date   LDLCALC 145 (H) 01/05/2021   Lab Results  Component Value Date   TRIG 114 01/05/2021   Lab Results  Component Value Date  CHOLHDL 4.4 01/05/2021   Lab Results  Component Value Date   HGBA1C 5.7 (H) 12/13/2019  Echocardiogram W Colorflow Spectral Doppler  Result Date: 04/04/2021 Patient Info Name: SEICHI [facility] Age: 69 years DOB: April 04, 1966 Gender: Male MRN: [id number] Accession #: [id number] [facility] Ht: 183 cm Wt: 118 kg BSA: 2.49 m2 BP: 132 / 89 mmHg Technical Quality: Good Exam Date: 04/04/2021 8:32 AM Site Location: [place] Exam Location: [facility] Admit Date: 04/02/2021 Exam Type: ECHOCARDIOGRAM W COLORFLOW SPECTRAL DOPPLER Study Info Indications - PE, assess for R heart strain Complete two-dimensional, color flow and Doppler transthoracic echocardiogram is performed. Staff Referring Physician: [id number], [place]; Sonographer: [email or web address]^^^^ Ordering Physician: Donetta Potts KLATT Account #: [id number] Summary 1. The left ventricle is normal in size with normal wall thickness. 2. The left ventricular systolic function is moderately decreased, LVEF is visually estimated at 35-40%. 3. There is grade I diastolic dysfunction (impaired relaxation). 4. The right  ventricle is normal in size, with low normal systolic function. 5. There is no pulmonary hypertension. Left Ventricle The left ventricle is normal in size with normal wall thickness. The left ventricular systolic function is moderately decreased, LVEF is visually estimated at 35-40%. There is grade I diastolic dysfunction (impaired relaxation). Right Ventricle The right ventricle is normal in size, with low normal systolic function. Left Atrium The left atrium is normal in size. Right Atrium The right atrium is normal in size. Aortic Valve The aortic valve is trileaflet with normal appearing leaflets with normal excursion. There is no significant aortic regurgitation. There is no evidence of a significant transvalvular gradient. Pulmonic Valve The pulmonic valve is normal. There is trivial pulmonic regurgitation. There is no evidence of a significant transvalvular gradient. Mitral Valve The mitral valve leaflets are mildly thickened with normal leaflet mobility. There is no significant mitral valve regurgitation. Tricuspid Valve The tricuspid valve leaflets are normal, with normal leaflet mobility. There is trivial tricuspid regurgitation. There is no pulmonary hypertension. TR maximum velocity: 2.3 m/s Estimated PASP: 24 mmHg. Other Findings Rhythm: Sinus Rhythm. Pulmonary Arteries The pulmonary artery is not well visualized. Pericardium/Pleural There is no pericardial effusion. Inferior Vena Cava IVC size and inspiratory change suggest normal right atrial pressure. (0-5 mmHg). Aorta The aorta is normal in size in the visualized segments. Left Ventricular Outflow Tract ---------------------------------------------------------------------- Name Value Normal ---------------------------------------------------------------------- LVOT 2D ---------------------------------------------------------------------- LVOT Diameter 2.4 cm LVOT Area 4.5 cm2 LVOT Doppler  ---------------------------------------------------------------------- LVOT Peak Velocity 0.8 m/s LVOT VTI 16 cm LVOT Stroke Volume 71 ml Pulmonic Valve ---------------------------------------------------------------------- Name Value Normal ---------------------------------------------------------------------- PV Doppler ---------------------------------------------------------------------- PV Peak Velocity 0.7 m/s Mitral Valve ---------------------------------------------------------------------- Name Value Normal ---------------------------------------------------------------------- MV Diastolic Function ---------------------------------------------------------------------- MV E Peak Velocity 42 cm/s MV A Peak Velocity 60 cm/s MV E/A 0.7 MV Annular TDI ---------------------------------------------------------------------- MV Septal e' Velocity 6.5 cm/s >=8.0 MV Lateral e' Velocity 12.2 cm/s >=10.0 MV e' Average 9.4 MV E/e' (Average) 5.0 Tricuspid Valve ---------------------------------------------------------------------- Name Value Normal ---------------------------------------------------------------------- TV Regurgitation Doppler ---------------------------------------------------------------------- TR Peak Velocity 2.3 m/s Estimated PAP/RSVP ---------------------------------------------------------------------- RA Pressure 3 mmHg <=5 RV Systolic Pressure 24 mmHg <36 Aorta ---------------------------------------------------------------------- Name Value Normal ---------------------------------------------------------------------- Ascending Aorta ---------------------------------------------------------------------- Ao Root Diameter (2D) 4.1 cm Ao Root Diam Index (2D) 16.5 cm/m2 Asc Ao Diameter 3.5 cm Thoracic Aorta ---------------------------------------------------------------------- Ao Arch Diameter 3.5 cm Venous ---------------------------------------------------------------------- Name Value Normal  ---------------------------------------------------------------------- IVC/SVC ---------------------------------------------------------------------- IVC Diameter (Exp 2D) 1.4 cm <=2.1 Aortic Valve ---------------------------------------------------------------------- Name Value Normal ---------------------------------------------------------------------- AV Doppler ---------------------------------------------------------------------- AV Peak Velocity 0.9 m/s AV Mean Gradient 2 mmHg AV VTI 17 cm AV Area (  Cont Eq VTI) 4.3 cm2 >=3.0 AV Area Index (Cont Eq VTI) 1.7 cm2/m2 AV Area (Cont Eq Vel) 3.9 cm2 AV Area Index (Cont Eq Vel) 1.6 cm2/m2 AV V1/V2 Ratio 0.85 Ventricles ---------------------------------------------------------------------- Name Value Normal ---------------------------------------------------------------------- LV Dimensions 2D/MM ---------------------------------------------------------------------- IVS Diastolic Thickness (2D) 0.9 cm 0.6-1.0 LVID Diastole (2D) 4.3 cm 3.9-7.6 LVPW Diastolic Thickness (2D) 0.9 cm 0.6-1.0 LVID Systole (2D) 2.8 cm 2.5-4.0 LVOT Diameter 2.4 cm RV Dimensions 2D/MM ---------------------------------------------------------------------- RV Basal Diastolic Dimension 3.3 cm 2.5-4.1 RV Mid-Cavity Diastolic Dimension 2.6 cm 1.9-3.5 TAPSE 2.1 cm >=1.7 Atria ---------------------------------------------------------------------- Name Value Normal ---------------------------------------------------------------------- LA Dimensions ---------------------------------------------------------------------- LA Dimension (2D) 3.1 cm 3.0-4.1 RA Dimensions ---------------------------------------------------------------------- RA Area (4C) 10.9 cm2 <=18.0 RA Area (4C) Index 4.4 cm2/m2 Report Signatures Finalized by Tor Netters MD on 04/04/2021 09:00 AM  PVL Venous Duplex Lower Extremity Right  Result Date: 04/02/2021 EXAM: PVL VENOUS DUPLEX LOWER EXTREMITY RIGHT DATE: 04/02/2021 5:08 PM  ACCESSION: [id number] [place] DICTATED: 04/02/2021 5:09 PM INTERPRETATION LOCATION: [place]: 55 years old Male with right leg pain, h/o DVT TECHNIQUE: The right common femoral, femoral, popliteal, posterior tibial and peroneal veins were evaluated for the presence of intraluminal obstruction. Transverse views were used to assess venous compressibility. Longitudinal orientation was used for Doppler assessment of hemodynamic flow characteristics. FINDINGS: Noncompressibility and absent Doppler signals in the right posterior tibial, popliteal, profunda femoris, and superficial femoral veins compatible with occlusive thrombus. The right common femoral vein demonstrates intraluminal thrombus but remains compressible. An accessory vein adjacent to the common femoral also demonstrates occlusive thrombus. The contralateral left common femoral vein was compressible, spontaneous and phasic.   Occlusive thrombus of the right posterior tibial, popliteal, profunda femoris, and superficial femoral veins. Nonocclusive thrombus of the right common femoral vein. The findings of this study were discussed via telephone with Dr. Herschel Senegal by Dr. Maurie Boettcher on 04/02/2021 5:57 PM.  CTA Chest W Contrast  Result Date: 04/03/2021 EXAM: CTA Chest for Pulmonary Embolus DATE: 04/02/2021 7:16 PM ACCESSION: [id number] [place] DICTATED: 04/02/2021 7:56 PM INTERPRETATION LOCATION: [place]: 55 years old Male with new murmur, DVT+ COMPARISON: None TECHNIQUE: Contiguous 1 and 2 mm axial images were reconstructed through the chest following a single breath hold helical acquisition. Images were reformatted in the sagittal and coronal planes. Multiplanar MIP slabs were created for more thorough evaluation of the pulmonary vasculature. Intravenous contrast was administered. The examination was performed with limited z-axis coverage and reduced kVp to improve vascular opacification and reduce breath hold and contrast  and radiation dose. kVP: 80 DLP: 112 FINDINGS: PULMONARY ARTERIES: Moderate to large burden of bilateral pulmonary emboli in the segmental and subsegmental branches of all lobes of the lungs. The right heart is not overtly dilated. No clear bowing of the interventricular septum. No significant reflux of contrast into the hepatic veins. Right to left ventricle ratio 43/57. Dilated coronary sinus. LUNGS AND AIRWAYS: The lungs are clear. The central airways are patent. PLEURA: No pleural fluid or pneumothorax. MEDIASTINUM AND LYMPH NODES: No enlarged intrathoracic or axillary lymph nodes are present. No other mediastinal abnormalities are present. HEART AND VASCULATURE: Cardiac chambers normal in size. Ascending and descending aorta normal in caliber. Dilated main pulmonary artery measuring 3.7 cm. There is no pericardial effusion. BONES AND SOFT TISSUES: No significant abnormalities. UPPER ABDOMEN:Negative.   Moderate to large burden of pulmonary emboli throughout the segmental and subsegmental branches of all of the lobes of the lung. Right to left ventricle ratio 43/57. Dilated coronary sinus. Dilated main pulmonary artery  measuring up to 3.7 cm. Clear lungs. No evidence of pulmonary infarct at the time of exam. Additional chronic/incidental findings as above. The findings of this study were discussed via telephone with DR. Lennox Grumbles in the [place] ED by Dr. Elliot Cousin on 04/02/2021 7:59 PM.     Assessment & Plan:   Problem List Items Addressed This Visit   None   No orders of the defined types were placed in this encounter.   Follow-up: No follow-ups on file.    Asencion Noble, MD

## 2021-05-07 DIAGNOSIS — Z5900 Homelessness unspecified: Secondary | ICD-10-CM | POA: Insufficient documentation

## 2021-05-07 DIAGNOSIS — F209 Schizophrenia, unspecified: Secondary | ICD-10-CM | POA: Insufficient documentation

## 2021-07-08 ENCOUNTER — Ambulatory Visit: Payer: Medicaid Other | Admitting: Internal Medicine

## 2021-08-13 DIAGNOSIS — R634 Abnormal weight loss: Secondary | ICD-10-CM | POA: Insufficient documentation

## 2021-12-20 ENCOUNTER — Encounter: Payer: Self-pay | Admitting: *Deleted

## 2021-12-20 NOTE — Congregational Nurse Program (Signed)
  Dept: [phone]   Congregational Nurse Program Note  Date of Encounter: 12/20/2021  Past Medical History: Past Medical History:  Diagnosis Date   Hypertension     Encounter Details:  CNP Questionnaire - 12/20/21 1312       Questionnaire   Do you give verbal consent to treat you today? Yes    Location Patient Served  [facility]    Visit Setting [place] [address]Phone/Text/Email    Patient Status Homeless    Insurance [facility]    Insurance Referral N/A    Medication N/A    Medical Provider Yes    Screening Referrals N/A    Medical Referral [facility];Other   [facility]   Medical Appointment Made N/A    Food N/A    Transportation N/A    Housing/Utilities No permanent housing    Interpersonal Safety N/A    Intervention Support    ED Visit Averted N/A    Life-Saving Intervention Made N/A            Client came to [facility] requesting help with mental health services. Client reports he is homeless currently staying in a motel and has a "care taker." He cannot locate his mental health medications. Assisted client in calling care taker Annabelle Harman [phone]. She is taking him to [facility] appt tomorrow in [place] with Dr. Garnette Czech. He gets his medication at [facility]. Client reports he has felt agitated today with so many people around. He denies si. Client contracts for safety. He plans to go get food at [facility] and straight back to [employer]. Gave [facility] and [facility] information to care giver and client. Discussed having client transferred from [facility] in [place] to closer location. Care giver is checking on this. Offered support and encouragement.  Daviyon Widmayer W RN CN

## 2022-01-18 ENCOUNTER — Other Ambulatory Visit: Payer: Self-pay

## 2022-01-18 ENCOUNTER — Ambulatory Visit: Payer: Medicaid Other | Admitting: Critical Care Medicine

## 2022-01-18 ENCOUNTER — Ambulatory Visit: Payer: Medicaid Other | Attending: Critical Care Medicine | Admitting: Critical Care Medicine

## 2022-01-18 VITALS — BP 117/83 | HR 82 | Temp 98.3°F | Ht 75.0 in | Wt 214.0 lb

## 2022-01-18 DIAGNOSIS — I2694 Multiple subsegmental pulmonary emboli without acute cor pulmonale: Secondary | ICD-10-CM

## 2022-01-18 DIAGNOSIS — Z5901 Sheltered homelessness: Secondary | ICD-10-CM

## 2022-01-18 DIAGNOSIS — F201 Disorganized schizophrenia: Secondary | ICD-10-CM

## 2022-01-18 DIAGNOSIS — I825Y2 Chronic embolism and thrombosis of unspecified deep veins of left proximal lower extremity: Secondary | ICD-10-CM

## 2022-01-18 DIAGNOSIS — Z5900 Homelessness unspecified: Secondary | ICD-10-CM

## 2022-01-18 DIAGNOSIS — I5020 Unspecified systolic (congestive) heart failure: Secondary | ICD-10-CM

## 2022-01-18 DIAGNOSIS — R634 Abnormal weight loss: Secondary | ICD-10-CM

## 2022-01-18 DIAGNOSIS — Z139 Encounter for screening, unspecified: Secondary | ICD-10-CM

## 2022-01-18 DIAGNOSIS — R7303 Prediabetes: Secondary | ICD-10-CM

## 2022-01-18 DIAGNOSIS — E039 Hypothyroidism, unspecified: Secondary | ICD-10-CM

## 2022-01-18 DIAGNOSIS — R0981 Nasal congestion: Secondary | ICD-10-CM

## 2022-01-18 DIAGNOSIS — I10 Essential (primary) hypertension: Secondary | ICD-10-CM

## 2022-01-18 MED ORDER — QUETIAPINE FUMARATE 25 MG PO TABS
25.0000 mg | ORAL_TABLET | Freq: Two times a day (BID) | ORAL | 4 refills | Status: DC
  Filled 2022-01-18: qty 60, 30d supply, fill #0

## 2022-01-18 MED ORDER — METOPROLOL SUCCINATE ER 25 MG PO TB24
25.0000 mg | ORAL_TABLET | Freq: Every day | ORAL | 1 refills | Status: AC
  Filled 2022-01-18: qty 90, 90d supply, fill #0

## 2022-01-18 MED ORDER — LORATADINE 10 MG PO TABS
10.0000 mg | ORAL_TABLET | Freq: Every day | ORAL | 0 refills | Status: AC
  Filled 2022-01-18: qty 30, 30d supply, fill #0

## 2022-01-18 MED ORDER — RIVAROXABAN 20 MG PO TABS
20.0000 mg | ORAL_TABLET | Freq: Every day | ORAL | 3 refills | Status: AC
  Filled 2022-01-18: qty 30, 30d supply, fill #0

## 2022-01-18 MED ORDER — FLUTICASONE PROPIONATE 50 MCG/ACT NA SUSP
1.0000 | Freq: Every day | NASAL | 0 refills | Status: AC | PRN
  Filled 2022-01-18: qty 16, 30d supply, fill #0

## 2022-01-18 NOTE — Assessment & Plan Note (Signed)
Patient has low protein level low albumin and is not eating well  We will ensure he gets nutritional supplements at the shelter we have a healthy supply of that there.  I gave him 5 max protein ensures at this visit and he immediately drank 2 of them in front of me  This patient cannot be cleared to give any more plasma donations for financial assistance until his protein levels improve

## 2022-01-18 NOTE — Assessment & Plan Note (Signed)
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

## 2022-01-18 NOTE — Assessment & Plan Note (Signed)
Continue low-dose metoprolol. 

## 2022-01-18 NOTE — Assessment & Plan Note (Signed)
Patient is currently in [facility] shelter I can follow him up there in my clinic

## 2022-01-18 NOTE — Progress Notes (Signed)
Established Patient Office Visit  Subjective   Patient ID: Donald French, male    DOB: 10-Apr-1966  Age: 56 y.o. MRN: [id number]  Chief Complaint  Patient presents with   Follow-up    Thyroid check. L knee problem "sometimes it feels like it's going to give out on me"    This is a 56 year old male who is homeless currently staying at the [facility].  He has been homeless for 2 years.  He only eats 1 meal a day and occasionally skips meals every day.  He is lost 56 pounds in weight since he has been homeless.  He has had history of chronic recurrent thromboembolic disease and is to be on Xarelto for life per hematology.  On arrival blood pressure 117/83.  Patient is wishing to donate at the plasma center however his albumin and protein levels at his last hematology visit in June were low.  Patient does have history of hypertension lower extremity edema on the left obesity in the past but now his weight is down to 214 pounds with a BMI 26.7 after 56 pound weight loss.  He does have varicose veins and chronic leg pain on the left.  He has had some postnasal drainage in the past.  He has not been seen in this clinic since August 2022.  Blood pressure on arrival was 117/83 today.  He has not been on medications recently except for low-dose metoprolol.  Patient has been followed in the past at a [facility] primary care clinic in [place] but he is now moved to [place] after being homeless in [place] and now wishes to reestablish care at our clinic.  He did have a prostate cancer screening a year ago was normal.  He was requesting to have his thyroid checked at this visit.  At the primary care office in [facility] 2 months ago he found to have high TSH. Below is documentation for the last hematology visit in [facility] this June  10/2021 heme unc Other Consulting Physicians:  Dr. Dot Lanes, [facility]  Reason for Consult: Recurrent VTE  Assessment/Recommendations: Donald French is  a 56 y.o. African American male who we are seeing in consultation at the request of Enid Cutter for further evaluation of recurrent VTE.  1. Venous Thromboembolism:  First episode was LLE DVT diagnosed 08/25/14. PVL showed "findings consistent with acute deep vein thrombosis involving the left distal femoral, popliteal, posterior tibial, peroneal, and gastrocnemius veins. Acute superficial thrombosis noted in the left lesser saphenous vein. No evidence of DVT in RLE." No identifiable risk factors for VTE at that time. He was treated with rivaroxaban $RemoveBeforeDE'20mg'eFsKwZToUxDhDwF$  for ~6 months, stopped when he was incarcerated. Symptoms resolved with treatment.   Second VTE episode was RLE DVT and segmental PE diagnosed 04/02/21. PVL LE showed "Occlusive thrombus of the right posterior tibial, popliteal, profunda femoris, and femoral veins. Nonocclusive thrombus of the right common femoral vein." CTA chest showed "Moderate to large burden of pulmonary emboli throughout the segmental and subsegmental branches of all of the lobes of the lung. Dilated main pulmonary artery measuring up to 3.7 cm." Troponin wnl but Pro-BNP elevated to 261. TTE showed reduced LVEF of 35-40%, grade I DD, and low-normal RVF, no pHTN. VTE risk factors: a) history of prior VTE. He was treated with rivaroxaban $RemoveBeforeDE'20mg'zthilapVkTtwCsw$  daily, which he remains on at this time. Symptoms have largely improved aside from mild intermittent RLE swelling.   Given multiple unprovoked VTE, he warrants  long-term anticoagulation. Adherence is a challenge, as he has unstable housing and his day-to-day is not very regimented. He does have good support between his primary caregiver and his peer support advocate. We discussed rivaroxaban vs apixaban, with drawbacks being need to take with food vs BID dosing, respectively. Decided at this time rivaroxaban is likely to have the highest efficacy for Donald French. Refilled today. Return in 6 months.  Plans: 1. Continue rivaroxaban 20mg   daily, duration of therapy long-term 2. We discussed taking rivaroxaban with food in order to properly absorb. He doesn't have a reliable schedule at this time, but will try to take it with his seroquel and some food in order to take consistently. Ultimately prefer he take it without food than not take it at all.  3. Return to clinic at least annually - will see back in 13mos for now  Francene Castle, MD Hematology/Oncology Fellow, PGY-4 -  History of Present Illness:  Donald French is a 56 y.o. African American male who we are seeing in consultation at the request of Dr. Albertina Senegal for further evaluation of recurrent VTE.  First thrombotic event was in 07/2014. He presented with LLE swelling to [facility]. BLE dopplers from 08/25/14 showed "findings consistent with acute deep vein thrombosis involving the left distal femoral, popliteal, posterior tibial, peroneal, and gastrocnemius veins. Acute superficial thrombosis noted in lesser saphenous vein. No evidence of DVT in RLE." No notable risk factors at that time. He was followed at [facility] and treated with rivaroxaban 20mg  daily. Symptoms resolved. He was incarcerated in the fall of 2016 and unfortunately his rivaroxaban was not continued during his incarceration. Remained off anticoagulation when he was released after 4 years.   Second thrombotic event occurred in 03/2021. He presented to [facility] with one week of RLE pain and swelling. [place] RLE on 04/02/21 showed "Occlusive thrombus of the right posterior tibial, popliteal, profunda femoris, and femoral veins. Nonocclusive thrombus of the right common femoral vein." LLE not imaged. CTA chest showed "Moderate to large burden of pulmonary emboli throughout the segmental and subsegmental branches of all of the lobes of the lung. Right to left ventricle ratio 43/57. Dilated coronary sinus. Dilated main pulmonary artery measuring up to 3.7 cm." Troponin wnl but Pro-BNP elevated to 261. TTE showed  reduced LVEF of 35-40%, grade I DD, and low-normal RVF, no pHTN. He was initially treated with heparin gtt then transitioned to rivaroxaban including high-intensity treatment window. His symptoms resolved aside from intermittent mild RLE swelling. He has remained on rivaroxaban since then.   Today he comes to clinic with his peer support advocate. He reports feeling well overall. Denies bleeding, SOB, CP, palpitations. Has pharmaceutical company assistance that covers the cost of rivaroxaban. Estimates he misses ~5 doses per month. He has also followed with cardiology for his HFrEF. Repeat TTE in 04/2021 showed improved EF to 50%. Ischemic workup deferred. He is maintained on metoprolol daily. Prior evaluation for hypercoagulability includes the following normal/negative studies: APLA panel, FVL, prothrombin gene, homocysteine, protein C, protein S, ATIII.    Visit Diagnoses: 1. Bilateral pulmonary embolism (CMS-HCC)  2. 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, unspecified laterality (CMS-HCC)     Patient Active Problem List   Diagnosis Date Noted   Acquired hypothyroidism 01/18/2022   Weight loss 08/13/2021   Homelessness 05/07/2021   Schizophrenia ([place]) 05/07/2021   HFrEF (heart failure with reduced ejection fraction) ([place]) 04/05/2021   Multiple subsegmental pulmonary emboli without acute cor pulmonale ([place])  04/03/2021   Prediabetes 12/15/2019   Hyperlipemia 12/15/2019   COVID-19 virus vaccination declined 12/13/2019   Asymptomatic varicose veins of left lower extremity 12/13/2019   Left leg DVT ([place]) 09/25/2014   Family history of prostate cancer 04/11/2014   Essential hypertension 04/11/2014   Past Medical History:  Diagnosis Date   Hypertension    Past Surgical History:  Procedure Laterality Date   SHOULDER SURGERY     Social History   Tobacco Use   Smoking status: Never   Smokeless tobacco: Never  Vaping Use   Vaping Use: Never used  Substance Use  Topics   Alcohol use: Not Currently   Drug use: No   Social History   Socioeconomic History   Marital status: Single    Spouse name: Not on file   Number of children: 0   Years of education: Not on file   Highest education level: Bachelor's degree (e.g., BA, AB, BS)  Occupational History   Occupation: light industrial  Tobacco Use   Smoking status: Never   Smokeless tobacco: Never  Vaping Use   Vaping Use: Never used  Substance and Sexual Activity   Alcohol use: Not Currently   Drug use: No   Sexual activity: Not on file  Other Topics Concern   Not on file  Social History Narrative   Not on file   Social Determinants of Health   Financial Resource Strain: Not on file  Food Insecurity: Not on file  Transportation Needs: Not on file  Physical Activity: Not on file  Stress: Not on file  Social Connections: Not on file  Intimate Partner Violence: Not on file   Family Status  Relation Name Status   Mother  Alive   Father  Deceased   PGM  (Not Specified)   Family History  Problem Relation Age of Onset   Hypertension Mother    Hypertension Father    Diabetes Father    Cancer Paternal Grandmother        lung cancer   No Known Allergies    Review of Systems  Constitutional:  Positive for weight loss. Negative for chills, diaphoresis, fever and malaise/fatigue.  HENT:  Negative for congestion, hearing loss, nosebleeds, sore throat and tinnitus.   Eyes:  Negative for blurred vision, photophobia and redness.  Respiratory:  Negative for cough, hemoptysis, sputum production, shortness of breath, wheezing and stridor.   Cardiovascular:  Positive for leg swelling. Negative for chest pain, palpitations, orthopnea, claudication and PND.  Gastrointestinal:  Negative for abdominal pain, blood in stool, constipation, diarrhea, heartburn, nausea and vomiting.  Genitourinary:  Negative for dysuria, flank pain, frequency, hematuria and urgency.  Musculoskeletal:  Negative for  back pain, falls, joint pain, myalgias and neck pain.  Skin:  Negative for itching and rash.  Neurological:  Negative for dizziness, tingling, tremors, sensory change, speech change, focal weakness, seizures, loss of consciousness, weakness and headaches.  Endo/Heme/Allergies:  Negative for environmental allergies and polydipsia. Does not bruise/bleed easily.  Psychiatric/Behavioral:  Negative for depression, memory loss, substance abuse and suicidal ideas. The patient is not nervous/anxious and does not have insomnia.       Objective:     BP 117/83 (BP Location: Right Arm, Patient Position: Sitting, Cuff Size: Large)   Pulse 82   Temp 98.3 F (36.8 C)   Ht $R'6\' 3"'mT$  (1.905 m)   Wt 214 lb (97.1 kg)   SpO2 98%   BMI 26.75 kg/m  BP Readings from Last 3 Encounters:  01/18/22 117/83  01/05/21 118/87  08/24/20 116/84   Wt Readings from Last 3 Encounters:  01/18/22 214 lb (97.1 kg)  01/05/21 270 lb (122.5 kg)  06/18/20 272 lb 9.6 oz (123.7 kg)      Physical Exam Vitals reviewed.  Constitutional:      Appearance: Normal appearance. He is well-developed. He is not diaphoretic.  HENT:     Head: Normocephalic and atraumatic.     Nose: No nasal deformity, septal deviation, mucosal edema or rhinorrhea.     Right Sinus: No maxillary sinus tenderness or frontal sinus tenderness.     Left Sinus: No maxillary sinus tenderness or frontal sinus tenderness.     Mouth/Throat:     Pharynx: No oropharyngeal exudate.  Eyes:     General: No scleral icterus.    Conjunctiva/sclera: Conjunctivae normal.     Pupils: Pupils are equal, round, and reactive to light.  Neck:     Thyroid: No thyromegaly.     Vascular: No carotid bruit or JVD.     Trachea: Trachea normal. No tracheal tenderness or tracheal deviation.  Cardiovascular:     Rate and Rhythm: Normal rate and regular rhythm.     Chest Wall: PMI is not displaced.     Pulses: Normal pulses. No decreased pulses.     Heart sounds: Normal heart  sounds, S1 normal and S2 normal. Heart sounds not distant. No murmur heard.    No systolic murmur is present.     No diastolic murmur is present.     No friction rub. No gallop. No S3 or S4 sounds.  Pulmonary:     Effort: No tachypnea, accessory muscle usage or respiratory distress.     Breath sounds: No stridor. No decreased breath sounds, wheezing, rhonchi or rales.  Chest:     Chest wall: No tenderness.  Abdominal:     General: Bowel sounds are normal. There is no distension.     Palpations: Abdomen is soft. Abdomen is not rigid.     Tenderness: There is no abdominal tenderness. There is no guarding or rebound.  Musculoskeletal:        General: Swelling present. No tenderness, deformity or signs of injury. Normal range of motion.     Cervical back: Normal range of motion and neck supple. No edema, erythema or rigidity. No muscular tenderness. Normal range of motion.     Right lower leg: No edema.     Left lower leg: Edema present.  Lymphadenopathy:     Head:     Right side of head: No submental or submandibular adenopathy.     Left side of head: No submental or submandibular adenopathy.     Cervical: No cervical adenopathy.  Skin:    General: Skin is warm and dry.     Coloration: Skin is not pale.     Findings: No rash.     Nails: There is no clubbing.  Neurological:     Mental Status: He is alert and oriented to person, place, and time.     Sensory: No sensory deficit.  Psychiatric:        Speech: Speech normal.        Behavior: Behavior normal.      No results found for any visits on 01/18/22.  Last CBC Lab Results  Component Value Date   WBC 5.3 01/05/2021   HGB 15.7 01/05/2021   HCT 48.2 01/05/2021   MCV 90 01/05/2021   MCH 29.3 01/05/2021   RDW  14.1 01/05/2021   PLT 189 07/22/3610   Last metabolic panel Lab Results  Component Value Date   GLUCOSE 87 01/05/2021   NA 141 01/05/2021   K 4.7 01/05/2021   CL 103 01/05/2021   CO2 23 01/05/2021   BUN 4 (L)  01/05/2021   CREATININE 1.25 01/05/2021   EGFR 68 01/05/2021   CALCIUM 8.9 01/05/2021   PROT 6.7 01/05/2021   ALBUMIN 4.1 01/05/2021   LABGLOB 2.6 01/05/2021   AGRATIO 1.6 01/05/2021   BILITOT 0.6 01/05/2021   ALKPHOS 51 01/05/2021   AST 29 01/05/2021   ALT 19 01/05/2021   ANIONGAP 8 08/25/2014   Last lipids Lab Results  Component Value Date   CHOL 213 (H) 01/05/2021   HDL 48 01/05/2021   LDLCALC 145 (H) 01/05/2021   TRIG 114 01/05/2021   CHOLHDL 4.4 01/05/2021   Last hemoglobin A1c Lab Results  Component Value Date   HGBA1C 5.7 (H) 12/13/2019   Last thyroid functions Lab Results  Component Value Date   TSH 2.641 07/08/2014   Last vitamin D Lab Results  Component Value Date   VD25OH 14 (L) 04/11/2014      The 10-year ASCVD risk score (Arnett DK, et al., 2019) is: 13.7%    Assessment & Plan:   Problem List Items Addressed This Visit       Cardiovascular and Mediastinum   Essential hypertension - Primary    Blood pressure stable this time we will continue low-dose metoprolol alone      Relevant Medications   metoprolol succinate (TOPROL-XL) 25 MG 24 hr tablet   rivaroxaban (XARELTO) 20 MG TABS tablet   Other Relevant Orders   Comprehensive metabolic panel   CBC with Differential/Platelet   Left leg DVT ([place])    Chronic recurrent thromboembolic disease with pulmonary emboli and deep venous thrombosis  Plan is for the patient to be on Xarelto for life  We sent refills on the patient's Xarelto to the pharmacy downstairs for patient assistance as he is uninsured  We will also follow-up CBC      Relevant Medications   metoprolol succinate (TOPROL-XL) 25 MG 24 hr tablet   rivaroxaban (XARELTO) 20 MG TABS tablet   Other Relevant Orders   CBC with Differential/Platelet   HFrEF (heart failure with reduced ejection fraction) (HCC)    Continue low-dose metoprolol      Relevant Medications   metoprolol succinate (TOPROL-XL) 25 MG 24 hr tablet    rivaroxaban (XARELTO) 20 MG TABS tablet   Multiple subsegmental pulmonary emboli without acute cor pulmonale (HCC)    No active pulmonary emboli continue chronic Xarelto      Relevant Medications   metoprolol succinate (TOPROL-XL) 25 MG 24 hr tablet   rivaroxaban (XARELTO) 20 MG TABS tablet     Endocrine   Acquired hypothyroidism    Elevated TSH at primary care visit in [place] 2 months ago we will reassess full thyroid panel at this visit note thyroid gland unremarkable on exam      Relevant Medications   metoprolol succinate (TOPROL-XL) 25 MG 24 hr tablet   Other Relevant Orders   Thyroid Panel With TSH     Other   Prediabetes    Reassess levels      Homelessness    Patient is currently in [facility] shelter I can follow him up there in my clinic      Schizophrenia ([place])    Appears to be stable at this  time and is on Seroquel we will refill same      Weight loss    Patient has low protein level low albumin and is not eating well  We will ensure he gets nutritional supplements at the shelter we have a healthy supply of that there.  I gave him 5 max protein ensures at this visit and he immediately drank 2 of them in front of me  This patient cannot be cleared to give any more plasma donations for financial assistance until his protein levels improve      Other Visit Diagnoses     Sinus congestion       Relevant Medications   fluticasone (FLONASE) 50 MCG/ACT nasal spray   loratadine (CLARITIN) 10 MG tablet   Encounter for health-related screening       Relevant Orders   Hemoglobin A1c      38 minutes spent reviewing old records complex decision making is high Return in about 4 months (around 05/20/2022) for htn.    Asencion Noble, MD

## 2022-01-18 NOTE — Patient Instructions (Signed)
No change in medications refills were sent to our pharmacy downstairs for future use  Complete health screening labs obtained today including thyroid function  Please drink a nutrition supplement 2-3 times daily we gave you samples we can give you more at the homeless shelter you are staying  Follow-up with Dr. Delford Field at the [facility] clinic in 1 week for reassessment before we can approve you for the plasma donations  Follow-up visit with your primary care provider will also be established in 4 months

## 2022-01-18 NOTE — Assessment & Plan Note (Signed)
Elevated TSH at primary care visit in [facility] 2 months ago we will reassess full thyroid panel at this visit note thyroid gland unremarkable on exam

## 2022-01-18 NOTE — Assessment & Plan Note (Signed)
No active pulmonary emboli continue chronic Xarelto

## 2022-01-18 NOTE — Assessment & Plan Note (Signed)
Blood pressure stable this time we will continue low-dose metoprolol alone

## 2022-01-18 NOTE — Assessment & Plan Note (Signed)
Appears to be stable at this time and is on Seroquel we will refill same

## 2022-01-18 NOTE — Assessment & Plan Note (Signed)
Reassess levels

## 2022-01-19 LAB — THYROID PANEL WITH TSH
Free Thyroxine Index: 1 — ABNORMAL LOW (ref 1.2–4.9)
T3 Uptake Ratio: 23 % — ABNORMAL LOW (ref 24–39)
T4, Total: 4.5 ug/dL (ref 4.5–12.0)
TSH: 2.91 u[IU]/mL (ref 0.450–4.500)

## 2022-01-19 LAB — COMPREHENSIVE METABOLIC PANEL
ALT: 11 IU/L (ref 0–44)
AST: 19 IU/L (ref 0–40)
Albumin/Globulin Ratio: 1.3 (ref 1.2–2.2)
Albumin: 2.8 g/dL — ABNORMAL LOW (ref 3.8–4.9)
Alkaline Phosphatase: 47 IU/L (ref 44–121)
BUN/Creatinine Ratio: 6 — ABNORMAL LOW (ref 9–20)
BUN: 6 mg/dL (ref 6–24)
Bilirubin Total: <0.2 mg/dL (ref 0.0–1.2)
CO2: 23 mmol/L (ref 20–29)
Calcium: 8.7 mg/dL (ref 8.7–10.2)
Chloride: 107 mmol/L — ABNORMAL HIGH (ref 96–106)
Creatinine, Ser: 1.06 mg/dL (ref 0.76–1.27)
Globulin, Total: 2.1 g/dL (ref 1.5–4.5)
Glucose: 64 mg/dL — ABNORMAL LOW (ref 70–99)
Potassium: 4.5 mmol/L (ref 3.5–5.2)
Sodium: 143 mmol/L (ref 134–144)
Total Protein: 4.9 g/dL — ABNORMAL LOW (ref 6.0–8.5)
eGFR: 83 mL/min/{1.73_m2} (ref >59)

## 2022-01-19 LAB — CBC WITH DIFFERENTIAL/PLATELET
Basophils Absolute: 0 10*3/uL (ref 0.0–0.2)
Basos: 0 %
EOS (ABSOLUTE): 0.1 10*3/uL (ref 0.0–0.4)
Eos: 3 %
Hematocrit: 42.8 % (ref 37.5–51.0)
Hemoglobin: 13.7 g/dL (ref 13.0–17.7)
Immature Grans (Abs): 0 10*3/uL (ref 0.0–0.1)
Immature Granulocytes: 0 %
Lymphocytes Absolute: 1.8 10*3/uL (ref 0.7–3.1)
Lymphs: 45 %
MCH: 29.8 pg (ref 26.6–33.0)
MCHC: 32 g/dL (ref 31.5–35.7)
MCV: 93 fL (ref 79–97)
Monocytes Absolute: 0.3 10*3/uL (ref 0.1–0.9)
Monocytes: 8 %
Neutrophils Absolute: 1.7 10*3/uL (ref 1.4–7.0)
Neutrophils: 44 %
Platelets: 231 10*3/uL (ref 150–450)
RBC: 4.59 x10E6/uL (ref 4.14–5.80)
RDW: 12.4 % (ref 11.6–15.4)
WBC: 4 10*3/uL (ref 3.4–10.8)

## 2022-01-19 LAB — HEMOGLOBIN A1C
Est. average glucose Bld gHb Est-mCnc: 100 mg/dL
Hgb A1c MFr Bld: 5.1 % (ref 4.8–5.6)

## 2022-01-19 NOTE — Progress Notes (Signed)
Let pt know liver kidney normal, thyroid was normal, no diabetes, protein levels low in blood so do NOT advise plasma donation now, continue with nutrition we will f/u at shelter to make sure he gets good nutrition and will recheck labs in 1 month  Blood counts normal no anemia or bleeding on blood thinner stay on blood thinner

## 2022-01-25 ENCOUNTER — Telehealth: Payer: Self-pay

## 2022-01-25 ENCOUNTER — Ambulatory Visit (INDEPENDENT_AMBULATORY_CARE_PROVIDER_SITE_OTHER): Payer: No Typology Code available for payment source | Admitting: Psychiatry

## 2022-01-25 ENCOUNTER — Encounter (HOSPITAL_COMMUNITY): Payer: Self-pay | Admitting: Psychiatry

## 2022-01-25 ENCOUNTER — Other Ambulatory Visit: Payer: Self-pay

## 2022-01-25 VITALS — BP 135/96 | HR 62 | Ht 75.0 in | Wt 203.0 lb

## 2022-01-25 DIAGNOSIS — F250 Schizoaffective disorder, bipolar type: Secondary | ICD-10-CM | POA: Diagnosis not present

## 2022-01-25 MED ORDER — QUETIAPINE FUMARATE 100 MG PO TABS
100.0000 mg | ORAL_TABLET | Freq: Every day | ORAL | 3 refills | Status: DC
  Filled 2022-01-25: qty 30, 30d supply, fill #0
  Filled 2022-03-01: qty 30, 30d supply, fill #1

## 2022-01-25 NOTE — Progress Notes (Signed)
Psychiatric Initial Adult Assessment   Patient Identification: Donald French MRN:  [id number] Date of Evaluation:  01/25/2022 Referral Source: Walk-in Chief Complaint:   Chief Complaint  Patient presents with   New Patient (Initial Visit)   Visit Diagnosis:    ICD-10-CM   1. Schizoaffective disorder, bipolar type (HCC)  F25.0 QUEtiapine (SEROQUEL) 100 MG tablet      History of Present Illness: 56 year old male seen today for initial psychiatric evaluation.  He walked into the clinic for medication management.  He has a psychiatric history of schizophrenia.  Currently he is managed on Seroquel 25 mg twice daily.  He informed Clinical research associate that his medications are somewhat effective in managing his psychiatric conditions.  Today today he is well-groomed, pleasant, cooperative, engaged in conversation, maintained eye contact.  He informed Clinical research associate that he is in need of refills of his medication.  He notes that he was in prison in [place] for 4 years and recently moved to [place].  Prior to his 4-year sentence he spent 10 years in prison.  He notes that all his charges were assault related.  Patient is now living in the [facility] as he is homeless and does not have family.  He did allow Clinical research associate to speak to a friend Annabelle Harman who resides in [place].  She informed Clinical research associate that she connected him with the [employer].  Throughout exam patient thought process is slowed.  His speech is slow and decreased.  He informed Clinical research associate that he at times has issues concentrating.  He notes that instructions need to be slow, precise, and simple.  He also informed Clinical research associate that at times he has difficulty reading.   Today he informed writer that he was uncertain about how he was feeling and notes that no one asked him how he feels so he does not know.  Today provider conducted a GAD-7 and patient scored an 11.  Provider also conducted PHQ-9 and patient scored a 10.  He endorses adequate sleep and appetite.  Patient endorses  symptoms of hypomania such as distractibility, irritability, racing thoughts, paranoia, and VAH.  He informed Clinical research associate that at times he believes people are talking about him which leads him to fight them.  He also notes that he sees shadows at the corner of his eyes.  He reports that his voices tell him that people are out to get him.  Today he endorses passive SI but denies wanting to harm himself.  Today patient denies tobacco or illegal drug use.  He does note that he drinks alcohol occasionally.  Patient and his friend Ms. Annabelle Harman requested a preer support specialist.  Provider referred to [employer] for peer support.  Patient informed Clinical research associate that he has difficulties maintaining his medication due to finances.  [facility].  Patient will see his PCP tomorrow at the [facility].  Today Seroquel increased from 50 mg to 100 mg to help manage symptoms of psychosis and mood.  He will continue other medications as prescribed.   Associated Signs/Symptoms: Depression Symptoms:  depressed mood, psychomotor agitation, fatigue, difficulty concentrating, suicidal thoughts without plan, anxiety, (Hypo) Manic Symptoms:  Distractibility, Elevated Mood, Flight of Ideas, Licensed conveyancer, Hallucinations, Irritable Mood, Anxiety Symptoms:  Excessive Worry, Psychotic Symptoms:  Hallucinations: Auditory Visual Paranoia, PTSD Symptoms: NA  Past Psychiatric History: Schizophrenia  Previous Psychotropic Medications:  Seroquel  Substance Abuse History in the last 12 months:  No.  Consequences of Substance Abuse: NA  Past Medical History:  Past Medical History:  Diagnosis Date   Hypertension     Past Surgical History:  Procedure Laterality Date   SHOULDER SURGERY      Family Psychiatric History: Denies  Family History:  Family History  Problem Relation Age of Onset   Hypertension Mother    Hypertension Father    Diabetes Father    Cancer  Paternal Grandmother        lung cancer    Social History:   Social History   Socioeconomic History   Marital status: Single    Spouse name: Not on file   Number of children: 0   Years of education: Not on file   Highest education level: Bachelor's degree (e.g., BA, AB, BS)  Occupational History   Occupation: light industrial  Tobacco Use   Smoking status: Never   Smokeless tobacco: Never  Vaping Use   Vaping Use: Never used  Substance and Sexual Activity   Alcohol use: Not Currently   Drug use: No   Sexual activity: Not on file  Other Topics Concern   Not on file  Social History Narrative   Not on file   Social Determinants of Health   Financial Resource Strain: Not on file  Food Insecurity: Not on file  Transportation Needs: Not on file  Physical Activity: Not on file  Stress: Not on file  Social Connections: Not on file    Additional Social History: Patient resides in [place]. He is single and has no children.  He [address] lives in the [address] He is unemployed. He denies tobacco or illegal drug use. He notes he drinks alcohol socially.   Allergies:  No Known Allergies  Metabolic Disorder Labs: Lab Results  Component Value Date   HGBA1C 5.1 01/18/2022   No results found for: "PROLACTIN" Lab Results  Component Value Date   CHOL 213 (H) 01/05/2021   TRIG 114 01/05/2021   HDL 48 01/05/2021   CHOLHDL 4.4 01/05/2021   VLDL 56 (H) 04/11/2014   LDLCALC 145 (H) 01/05/2021   LDLCALC 132 (H) 12/13/2019   Lab Results  Component Value Date   TSH 2.910 01/18/2022    Therapeutic Level Labs: No results found for: "LITHIUM" No results found for: "CBMZ" No results found for: "VALPROATE"  Current Medications: Current Outpatient Medications  Medication Sig Dispense Refill   fluticasone (FLONASE) 50 MCG/ACT nasal spray Place 1 spray into both nostrils daily as needed for allergies or rhinitis. 16 g 0   loratadine (CLARITIN) 10 MG tablet Take 1 tablet  (10 mg total) by mouth daily. 30 tablet 0   metoprolol succinate (TOPROL-XL) 25 MG 24 hr tablet Take 1 tablet (25 mg total) by mouth daily. 90 tablet 1   QUEtiapine (SEROQUEL) 100 MG tablet Take 1 tablet (100 mg total) by mouth at bedtime. 30 tablet 3   rivaroxaban (XARELTO) 20 MG TABS tablet Take 1 tablet (20 mg total) by mouth daily. 30 tablet 3   No current facility-administered medications for this visit.    Musculoskeletal: Strength & Muscle Tone: within normal limits Gait & Station: normal Patient leans: N/A  Psychiatric Specialty Exam: Review of Systems  Blood pressure (!) 135/96, pulse 62, height 6\' 3"  (1.905 m), weight 203 lb (92.1 kg).Body mass index is 25.37 kg/m.  General Appearance: Well Groomed  Eye Contact:  Good  Speech:  Slow  Volume:  Decreased  Mood:  Anxious and Depressed  Affect:  Appropriate and Congruent  Thought Process:  Coherent, Goal Directed, and  Linear  Orientation:  Full (Time, Place, and Person)  Thought Content:  Logical, Hallucinations: Auditory Visual, and Paranoid Ideation  Suicidal Thoughts:  Yes.  without intent/plan  Homicidal Thoughts:  No  Memory:  Immediate;   Good Recent;   Good Remote;   Good  Judgement:  Good  Insight:  Good  Psychomotor Activity:  Normal  Concentration:  Concentration: Fair and Attention Span: Fair  Recall:  Good  Fund of Knowledge:Good  Language: Good  Akathisia:  No  Handed:  Right  AIMS (if indicated):  not done  Assets:  Communication Skills Desire for Improvement Housing Leisure Time Physical Health  ADL's:  Intact  Cognition: WNL  Sleep:  Good   Screenings: GAD-7    Flowsheet Row Office Visit from 01/25/2022 in [facility] Office Visit from 01/18/2022 in [facility] Office Visit from 01/05/2021 in [facility] Office Visit from 06/18/2020 in [facility] Office Visit from 03/17/2020 in  [facility]  Total GAD-7 Score 11 21 0 0 1      PHQ2-9    Flowsheet Row Office Visit from 01/25/2022 in [facility] Office Visit from 01[facility]18/2022 in [facility] Office Visit from 01/05/2021 in [facility] Office Visit from 06/18/2020 in [facility] Office Visit from 03/17/2020 in [facility]  PHQ-2 Total Score 2 5 0 0 0  PHQ-9 Total Score 10 -- -- -- --      Flowsheet Row Office Visit from 01/25/2022 in [facility]  C-SSRS RISK CATEGORY Error: Q7 should not be populated when Q6 is No       Assessment and Plan: Patient endorses mild anxiety/depression, VAH and paranoia.  Today he is agreeable to increasing Seroquel 50 mg to 100 mg to help manage symptoms of psychosis an anxiety.  Patient given resources to the [employer].  1. Schizoaffective disorder, bipolar type (HCC)  Increase- QUEtiapine (SEROQUEL) 100 MG tablet; Take 1 tablet (100 mg total) by mouth at bedtime.  Dispense: 30 tablet; Refill: 3   Collaboration of Care: Other provider involved in patient's care AEB PCP  Patient/Guardian was advised Release of Information must be obtained prior to any record release in order to collaborate their care with an outside provider. Patient/Guardian was advised if they have not already done so to contact the registration department to sign all necessary forms in order for [place] to release information regarding their care.   Consent: Patient/Guardian gives verbal consent for treatment and assignment of benefits for services provided during this visit. Patient/Guardian expressed understanding and agreed to proceed.   5 follow-up in 3 months Shanna Cisco, NP 8/29/202311:08 AM

## 2022-01-25 NOTE — Telephone Encounter (Signed)
Pt was called and is aware of results, DOB was confirmed.  ?

## 2022-01-26 ENCOUNTER — Encounter: Payer: Self-pay | Admitting: Physician Assistant

## 2022-01-26 ENCOUNTER — Other Ambulatory Visit: Payer: Self-pay | Admitting: Critical Care Medicine

## 2022-01-26 DIAGNOSIS — R634 Abnormal weight loss: Secondary | ICD-10-CM

## 2022-01-26 DIAGNOSIS — E778 Other disorders of glycoprotein metabolism: Secondary | ICD-10-CM

## 2022-01-26 NOTE — Progress Notes (Signed)
Pt seen by Dr Delford Field.  Labs reviewed.  CBC    Component Value Date/Time   WBC 4.0 01/18/2022 1034   WBC 4.3 11/03/2014 1017   RBC 4.59 01/18/2022 1034   RBC 5.15 11/03/2014 1017   HGB 13.7 01/18/2022 1034   HCT 42.8 01/18/2022 1034   PLT 231 01/18/2022 1034   MCV 93 01/18/2022 1034   MCH 29.8 01/18/2022 1034   MCH 30.1 11/03/2014 1017   MCHC 32.0 01/18/2022 1034   MCHC 33.5 11/03/2014 1017   RDW 12.4 01/18/2022 1034   LYMPHSABS 1.8 01/18/2022 1034   MONOABS 0.3 11/03/2014 1017   EOSABS 0.1 01/18/2022 1034   BASOSABS 0.0 01/18/2022 1034   Lab Results  Component Value Date   HGBA1C 5.1 01/18/2022   Lab Results  Component Value Date   TSH 2.910 01/18/2022      Latest Ref Rng & Units 01/18/2022   10:34 AM 01/05/2021    2:08 PM 12/13/2019    9:35 AM  CMP  Glucose 70 - 99 mg/dL 64  87  82   BUN 6 - 24 mg/dL 6  4  9    Creatinine 0.76 - 1.27 mg/dL  0.45  4.09   Sodium 134 - 144 mmol/L 143  141  142   Potassium 3.5 - 5.2 mmol/L 4.5  4.7  5.1   Chloride 96 - 106 mmol/L 107  103  103   CO2 20 - 29 mmol/L 23  23  28    Calcium 8.7 - 10.2 mg/dL 8.7  8.9  8.8   Total Protein 6.0 - 8.5 g/dL 4.9  6.7  5.8   Total Bilirubin 0.0 - 1.2 mg/dL 8.11  0.6  0.4   Alkaline Phos 44 - 121 IU/L 47  51  45   AST 0 - 40 IU/L 19  29  29    ALT 0 - 44 IU/L 11  19  35    Albumin  Date Value Ref Range Status  01/18/2022 2.8 (L) 3.8 - 4.9 g/dL Final   He has been donating plasma for money. He has gone about 4 x this summer.   He is eating poorly. He has lost weight.  He was given protein drinks  He will get labs done next week and follow up.  <9.1, PA-C 01/26/2022 3:04 PM

## 2022-01-26 NOTE — Progress Notes (Signed)
Error

## 2022-02-01 ENCOUNTER — Ambulatory Visit: Payer: Medicaid Other | Attending: Internal Medicine

## 2022-02-01 DIAGNOSIS — R634 Abnormal weight loss: Secondary | ICD-10-CM

## 2022-02-01 DIAGNOSIS — E778 Other disorders of glycoprotein metabolism: Secondary | ICD-10-CM

## 2022-02-02 ENCOUNTER — Telehealth: Payer: Self-pay | Admitting: Emergency Medicine

## 2022-02-02 LAB — COMPREHENSIVE METABOLIC PANEL
ALT: 16 IU/L (ref 0–44)
AST: 20 IU/L (ref 0–40)
Albumin/Globulin Ratio: 1.8 (ref 1.2–2.2)
Albumin: 3.5 g/dL — ABNORMAL LOW (ref 3.8–4.9)
Alkaline Phosphatase: 41 IU/L — ABNORMAL LOW (ref 44–121)
BUN/Creatinine Ratio: 6 — ABNORMAL LOW (ref 9–20)
BUN: 7 mg/dL (ref 6–24)
Bilirubin Total: 0.2 mg/dL (ref 0.0–1.2)
CO2: 25 mmol/L (ref 20–29)
Calcium: 8.8 mg/dL (ref 8.7–10.2)
Chloride: 107 mmol/L — ABNORMAL HIGH (ref 96–106)
Creatinine, Ser: 1.08 mg/dL (ref 0.76–1.27)
Globulin, Total: 1.9 g/dL (ref 1.5–4.5)
Glucose: 97 mg/dL (ref 70–99)
Potassium: 4.4 mmol/L (ref 3.5–5.2)
Sodium: 142 mmol/L (ref 134–144)
Total Protein: 5.4 g/dL — ABNORMAL LOW (ref 6.0–8.5)
eGFR: 81 mL/min/{1.73_m2} (ref >59)

## 2022-02-02 NOTE — Telephone Encounter (Signed)
Copied from CRM [id number]. Topic: General - Other >> Feb 02, 2022 12:09 PM Lyman Speller wrote: Reason for CRM: Pt is coming in tomorrow at 8:30 as a walk in and asked to get paper work from Dr. Delford Field with his protein levels on it so he can take it to the plasma place/ please advise

## 2022-02-02 NOTE — Telephone Encounter (Signed)
Is he able to give plasma now ?

## 2022-02-02 NOTE — Telephone Encounter (Signed)
Yes I will send authorization tomorrow

## 2022-02-03 ENCOUNTER — Telehealth: Payer: Self-pay | Admitting: Critical Care Medicine

## 2022-02-03 ENCOUNTER — Ambulatory Visit: Payer: Self-pay | Attending: Internal Medicine | Admitting: Physician Assistant

## 2022-02-03 ENCOUNTER — Encounter: Payer: Self-pay | Admitting: Critical Care Medicine

## 2022-02-03 DIAGNOSIS — L709 Acne, unspecified: Secondary | ICD-10-CM

## 2022-02-03 DIAGNOSIS — H539 Unspecified visual disturbance: Secondary | ICD-10-CM

## 2022-02-03 NOTE — Telephone Encounter (Signed)
Noted  

## 2022-02-03 NOTE — Telephone Encounter (Signed)
Patient came in office for a visit today and was given information provided. Patient verbalized understanding

## 2022-02-03 NOTE — Patient Instructions (Signed)
Benzoyl Peroxide Cream, Gel, or Lotion What is this medication? BENZOYL PEROXIDE (BEN zoe ill per OX ide) treats acne or rosacea. It works by decreasing inflammation and killing or preventing the growth of bacteria on your skin. It belongs to a group of medications called antibiotics. This medicine may be used for other purposes; ask your health care provider or pharmacist if you have questions. COMMON BRAND NAME(S): Acne Medication, Acne-10, Acneclear, Benprox, Benzac, Benzac AC, Benzac W, Benzac-10, Benzac-5, Benzagel, Benzagel-10, Benzagel-5, BenzaShave, BenzEFoam, BenzEFoam Ultra, BenzePrO, Benziq, Benziq LS, BP Cleansing Lotion, BP Gel, BP Topical, BPO, Brevoxyl-4, Brevoxyl-8, Clean&Clear Persa-Gel, Clearasil, Clearasil Ultra, Clearasil Vanishing, Clearplex, Clearplex X, Clearskin, Clinac BPO, Del Aqua, [place], [place], [place], EFFACLAR, [place], Ignacio, Lavoclen-4 Acne Wash Kit, Lavoclen-8 Acne Wash Kit, NeoBenz, NeoBenz Micro, [employer] Micro Cream Plus Pack, NeoBenz Micro SD, Neutrogena Acne Cream, OC8, [place], PanOxyl, PanOxyl AQ, PanOxyl Aqua, PanOxyl-10, PanOxyl-5, RE Benzoyl Peroxide, Riax, Seba, Seba-Gel, Soluclenz Rx, Theroxide, Triaz, Zoderm, Zoderm Cream, Zoderm Gel What should I tell my care team before I take this medication? They need to know if you have any of these conditions: Large area of burned or damaged skin Lung or breathing disease (asthma) Skin conditions or sensitivity An unusual or allergic reaction to benzoyl peroxide, other medications, foods, dyes, or preservatives Pregnant or trying to get pregnant Breast-feeding How should I use this medication? This medication is for external use only. Do not take by mouth. Wash your hands before and after use. If you are treating your hands, only wash your hands before use. Do not get it in your eyes. If you do, rinse your eyes with plenty of cool tap water. Use it as directed on the prescription label at the same time every  day. Do not use it more often than directed. Use the medication for the full course as directed by your care team, even if you think you are better. Do not stop using it unless your care team tells you to stop it early. Apply a thin film of the medication to the affected area. Talk to your care team about the use of this medication in children. Special care may be needed. Overdosage: If you think you have taken too much of this medicine contact a poison control center or emergency room at once. NOTE: This medicine is only for you. Do not share this medicine with others. What if I miss a dose? If you miss a dose, take it as soon as you can. If it is almost time for your next dose, take only that dose. Do not take double or extra doses. What may interact with this medication? Adapalene Isotretinoin Salicylic acid or sulfur-containing products Topical antibiotics, such as clindamycin or erythromycin Tretinoin This list may not describe all possible interactions. Give your health care provider a list of all the medicines, herbs, non-prescription drugs, or dietary supplements you use. Also tell them if you smoke, drink alcohol, or use illegal drugs. Some items may interact with your medicine. What should I watch for while using this medication? Visit your care team for regular checks on your progress. It may be some time before you see the benefit from this medication. Do not use other products that dry the skin. Examples include abrasive cleaners or products with alcohol in them. Do not use other acne products on the same areas of the skin as this one unless your care team tells you to use both. Avoid eating or drinking foods or drinks that may make skin  redness, flushing, and blushing worse. Examples include spicy foods, alcohol, hot coffee, or hot tea. This medication can make you more sensitive to the sun. Keep out of the sun. If you cannot avoid being in the sun, wear protective clothing and  sunscreen. Do not use sun lamps or tanning beds/booths. This medication may cause white fabric to turn yellow. Wash yellowed fabrics with laundry detergent. What side effects may I notice from receiving this medication? Side effects that you should report to your care team as soon as possible: Allergic reactions--skin rash, itching, hives, swelling of the face, lips, tongue, or throat Burning, itching, crusting, or peeling of treated skin Side effects that usually do not require medical attention (report to your care team if they continue or are bothersome): Mild skin irritation, redness, or dryness Sensitivity to light This list may not describe all possible side effects. Call your doctor for medical advice about side effects. You may report side effects to FDA at 1-800-FDA-1088. Where should I keep my medication? Keep out of the reach of children and pets. Store at room temperature between 15 and 30 degrees C (59 and 86 degrees F). Do not freeze. Protect from light and moisture. Keep the container tightly closed. Avoid exposure to extreme heat. Get rid of medications that are no longer needed or have expired: Take the medication to a medication take-back program. Check with your pharmacy or law enforcement to find a location. If you cannot return the medication, check the label or package insert to see if the medication should be thrown out in the garbage or flushed down the toilet. If you are not sure, ask your care team. If it is safe to put in the trash, take the medication out of the container. Mix the medication with cat litter, dirt, coffee grounds, or other unwanted substance. Seal the mixture in a bag or container. Put it in the trash. NOTE: This sheet is a summary. It may not cover all possible information. If you have questions about this medicine, talk to your doctor, pharmacist, or health care provider.  2023 Elsevier/Gold Standard (2021-03-17 00:00:00)

## 2022-02-03 NOTE — Progress Notes (Signed)
Patient ID: Donald French, male   DOB: 1965/07/13, 56 y.o.   MRN: [id number]   Donald French, is a 56 y.o. male  [id number]  [id number]  DOB - Feb 07, 1966  No chief complaint on file.      Subjective:   Donald French is a 56 y.o. male here today for vision changes over the last several months.  He has not seen an eye doctor in more than 1 year.  No acute changes.  Glasses just not working as well.  Also c/o acne  No problems updated.  ALLERGIES: No Known Allergies  PAST MEDICAL HISTORY: Past Medical History:  Diagnosis Date   Hypertension     MEDICATIONS AT HOME: Prior to Admission medications   Medication Sig Start Date End Date Taking? Authorizing Provider  fluticasone (FLONASE) 50 MCG/ACT nasal spray Place 1 spray into both nostrils daily as needed for allergies or rhinitis. 01/18/22   Donald Frisk, MD  loratadine (CLARITIN) 10 MG tablet Take 1 tablet (10 mg total) by mouth daily. 01/18/22   Donald Frisk, MD  metoprolol succinate (TOPROL-XL) 25 MG 24 hr tablet Take 1 tablet (25 mg total) by mouth daily. 01/18/22   Donald Frisk, MD  QUEtiapine (SEROQUEL) 100 MG tablet Take 1 tablet (100 mg total) by mouth at bedtime. 01/25/22   Donald Cisco, NP  rivaroxaban (XARELTO) 20 MG TABS tablet Take 1 tablet (20 mg total) by mouth daily. 01/18/22 01/18/23  Donald Frisk, MD    ROS: Neg resp Neg cardiac Neg GI Neg GU Neg MS Neg psych Neg neuro  Objective:  There were no vitals filed for this visit. Exam General appearance : Awake, alert, not in any distress. Speech Clear. Not toxic looking HEENT: Atraumatic and Normocephalic, pupils equally reactive to light and accomodation, EOM intact Neck: Supple, no JVD. No cervical lymphadenopathy.  Chest: Good air entry bilaterally, CTAB.  No rales/rhonchi/wheezing CVS: S1 S2 regular, no murmurs.  Neurology: Awake alert, and oriented X 3, CN II-XII intact, Non focal Skin: ~ 5 scattered acne  lesions-almost not visible.  Very mild  Data Review Lab Results  Component Value Date   HGBA1C 5.1 01/18/2022   HGBA1C 5.7 (H) 12/13/2019   HGBA1C 5.5 04/11/2014    Assessment & Plan   1. Acne, unspecified acne type Benzoyl peroxide, etc, proper skin care  2. Vision changes - Ambulatory referral to Ophthalmology    Return for cancel appt with me (in December)and schedule with DR Laural Benes.  The patient was given clear instructions to go to ER or return to medical center if symptoms don't improve, worsen or new problems develop. The patient verbalized understanding. The patient was told to call to get lab results if they haven't heard anything in the next week.      Donald Co, PA-C [facility] [place], [place] [phone]   02/03/2022, 10:54 AM

## 2022-02-03 NOTE — Telephone Encounter (Signed)
Call and let patient know his albumin and protein levels have improved he can resume donating plasma I have sent a release to the plasma center.  He can only give 1 donation a week.  If he cannot donate more than twice a week.  He needs to continue to work on his nutrition

## 2022-02-08 NOTE — Telephone Encounter (Signed)
Patient came in and pick up paperwork, made a copy for our records

## 2022-02-08 NOTE — Telephone Encounter (Signed)
Pt stated the plasma center didn't receive the form from Dr. Delford Field / pt stated he will come pick it up in an Hr / please advise

## 2022-03-01 ENCOUNTER — Other Ambulatory Visit: Payer: Self-pay

## 2022-03-02 ENCOUNTER — Other Ambulatory Visit: Payer: Self-pay

## 2022-03-08 ENCOUNTER — Encounter (HOSPITAL_COMMUNITY): Payer: Self-pay | Admitting: Student in an Organized Health Care Education/Training Program

## 2022-03-08 ENCOUNTER — Other Ambulatory Visit: Payer: Self-pay

## 2022-03-08 ENCOUNTER — Ambulatory Visit (INDEPENDENT_AMBULATORY_CARE_PROVIDER_SITE_OTHER)
Payer: No Typology Code available for payment source | Admitting: Student in an Organized Health Care Education/Training Program

## 2022-03-08 VITALS — BP 124/93 | HR 76 | Resp 16 | Wt 208.0 lb

## 2022-03-08 DIAGNOSIS — F250 Schizoaffective disorder, bipolar type: Secondary | ICD-10-CM | POA: Diagnosis not present

## 2022-03-08 MED ORDER — QUETIAPINE FUMARATE 200 MG PO TABS
200.0000 mg | ORAL_TABLET | Freq: Two times a day (BID) | ORAL | 1 refills | Status: DC
  Filled 2022-03-08: qty 30, 15d supply, fill #0
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
## 2022-03-29 ENCOUNTER — Ambulatory Visit (INDEPENDENT_AMBULATORY_CARE_PROVIDER_SITE_OTHER)
Payer: No Typology Code available for payment source | Admitting: Student in an Organized Health Care Education/Training Program

## 2022-03-29 ENCOUNTER — Encounter (HOSPITAL_COMMUNITY): Payer: Self-pay | Admitting: Student in an Organized Health Care Education/Training Program

## 2022-03-29 VITALS — BP 121/82 | HR 76 | Resp 20 | Wt 213.0 lb

## 2022-03-29 DIAGNOSIS — F602 Antisocial personality disorder: Secondary | ICD-10-CM

## 2022-03-29 DIAGNOSIS — F250 Schizoaffective disorder, bipolar type: Secondary | ICD-10-CM | POA: Diagnosis not present

## 2022-03-29 MED ORDER — QUETIAPINE FUMARATE 200 MG PO TABS
200.0000 mg | ORAL_TABLET | Freq: Every day | ORAL | 1 refills | Status: AC
  Filled 2022-03-29: qty 11, 11d supply, fill #0
  Filled 2022-03-31: qty 19, 19d supply, fill #0
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
## 2022-03-30 ENCOUNTER — Other Ambulatory Visit: Payer: Self-pay

## 2022-03-31 ENCOUNTER — Other Ambulatory Visit: Payer: Self-pay

## 2022-04-01 ENCOUNTER — Other Ambulatory Visit: Payer: Self-pay

## 2022-04-05 NOTE — Congregational Nurse Program (Signed)
  Dept: [phone]   Congregational Nurse Program Note  Date of Encounter: 04/05/2022  Clinic visit for broken eye glass frame and complaints of decreasing near vision.  To have appointment made for eye examination. Past Medical History: Past Medical History:  Diagnosis Date   Hypertension     Encounter Details:  CNP Questionnaire - 04/05/22 1118       Questionnaire   Ask client: Do you give verbal consent for me to treat you today? Yes    Student Assistance N/A    Location Patient [facility]    Visit Setting with Client Organization    Patient Status Unhoused    Insurance Uninsured ([employer]/Care [employer]s/Self-Pay/Medicaid Family Planning)    Insurance/Financial Assistance Referral N/A    Medication Have Medication Insecurities    Medical Provider Yes    Screening Referrals Made N/A    Medical Referrals Made Vision    Medical Appointment Made N/A    Recently w/o PCP, now 1st time PCP visit completed due to CNs referral or appointment made N/A    Food Have Food Insecurities    Transportation Need transportation assistance    Housing/Utilities No permanent housing    Interpersonal Safety Do not feel safe at current residence    Interventions Counsel    Abnormal to Normal Screening Since Last CN Visit N/A    Screenings CN Performed N/A    Sent Client to Lab for: N/A    Did client attend any of the following based off CNs referral or appointments made? N/A    ED Visit Averted N/A    Life-Saving Intervention Made N/A

## 2022-04-06 ENCOUNTER — Encounter: Payer: Self-pay | Admitting: Critical Care Medicine

## 2022-04-06 ENCOUNTER — Encounter: Payer: Self-pay | Admitting: *Deleted

## 2022-04-06 ENCOUNTER — Other Ambulatory Visit: Payer: Self-pay | Admitting: Critical Care Medicine

## 2022-04-06 DIAGNOSIS — H539 Unspecified visual disturbance: Secondary | ICD-10-CM
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
## 2022-04-07 ENCOUNTER — Other Ambulatory Visit (HOSPITAL_COMMUNITY): Payer: Self-pay

## 2022-04-07 NOTE — Progress Notes (Signed)
Patient seen at the [place] [facility] and is a 56 year old male primary care patient of Dr. Laural Benes.  He complains that his last visit he has had for 2 years are broken and that the right part of the frame is not staying on his ear because of the connector to the main frame.  Also he has had change in vision and would like to have an eye exam.  He cannot see up close and we try to give him some readers at a 2.0-3.0 range and these did not improve his vision.  He does have full Medicaid and is agreeable to an eye exam  We could not get him in with [facility] eye care until April 2024  We got him an appointment with the [facility] and he will go there for an exam and probable eyeglass adjustment

## 2022-04-14 ENCOUNTER — Emergency Department (HOSPITAL_COMMUNITY): Payer: Medicaid Other

## 2022-04-14 ENCOUNTER — Emergency Department (HOSPITAL_COMMUNITY): Payer: Medicaid Other | Admitting: Anesthesiology

## 2022-04-14 ENCOUNTER — Encounter (HOSPITAL_COMMUNITY): Admission: EM | Disposition: E | Payer: Self-pay | Source: Home / Self Care

## 2022-04-14 ENCOUNTER — Encounter (HOSPITAL_COMMUNITY): Payer: Self-pay

## 2022-04-14 ENCOUNTER — Inpatient Hospital Stay (HOSPITAL_COMMUNITY): Payer: Medicaid Other

## 2022-04-14 ENCOUNTER — Inpatient Hospital Stay (HOSPITAL_COMMUNITY)
Admission: EM | Admit: 2022-04-14 | Discharge: 2022-04-29 | DRG: 405 | Disposition: E | Payer: Medicaid Other | Attending: General Surgery | Admitting: General Surgery

## 2022-04-14 ENCOUNTER — Other Ambulatory Visit: Payer: Self-pay

## 2022-04-14 ENCOUNTER — Other Ambulatory Visit (HOSPITAL_COMMUNITY): Payer: Self-pay

## 2022-04-14 DIAGNOSIS — F319 Bipolar disorder, unspecified: Secondary | ICD-10-CM | POA: Diagnosis present

## 2022-04-14 DIAGNOSIS — Z7901 Long term (current) use of anticoagulants: Secondary | ICD-10-CM

## 2022-04-14 DIAGNOSIS — X789XXA Intentional self-harm by unspecified sharp object, initial encounter: Secondary | ICD-10-CM | POA: Diagnosis present

## 2022-04-14 DIAGNOSIS — Z86711 Personal history of pulmonary embolism: Secondary | ICD-10-CM

## 2022-04-14 DIAGNOSIS — S31109A Unspecified open wound of abdominal wall, unspecified quadrant without penetration into peritoneal cavity, initial encounter: Principal | ICD-10-CM

## 2022-04-14 DIAGNOSIS — E876 Hypokalemia: Secondary | ICD-10-CM | POA: Diagnosis present

## 2022-04-14 DIAGNOSIS — F209 Schizophrenia, unspecified: Secondary | ICD-10-CM | POA: Diagnosis present

## 2022-04-14 DIAGNOSIS — S36116A Major laceration of liver, initial encounter: Principal | ICD-10-CM | POA: Diagnosis present

## 2022-04-14 DIAGNOSIS — K661 Hemoperitoneum: Secondary | ICD-10-CM | POA: Diagnosis present

## 2022-04-14 DIAGNOSIS — I10 Essential (primary) hypertension: Secondary | ICD-10-CM | POA: Diagnosis present

## 2022-04-14 DIAGNOSIS — R578 Other shock: Secondary | ICD-10-CM | POA: Diagnosis present

## 2022-04-14 DIAGNOSIS — S71011A Laceration without foreign body, right hip, initial encounter: Secondary | ICD-10-CM | POA: Diagnosis present

## 2022-04-14 DIAGNOSIS — S61011A Laceration without foreign body of right thumb without damage to nail, initial encounter: Secondary | ICD-10-CM | POA: Diagnosis present

## 2022-04-14 DIAGNOSIS — S31610A Laceration without foreign body of abdominal wall, right upper quadrant with penetration into peritoneal cavity, initial encounter: Secondary | ICD-10-CM | POA: Diagnosis present

## 2022-04-14 DIAGNOSIS — K683 Retroperitoneal hematoma: Secondary | ICD-10-CM

## 2022-04-14 DIAGNOSIS — Z66 Do not resuscitate: Secondary | ICD-10-CM | POA: Diagnosis not present

## 2022-04-14 DIAGNOSIS — D689 Coagulation defect, unspecified: Secondary | ICD-10-CM | POA: Diagnosis present

## 2022-04-14 DIAGNOSIS — N289 Disorder of kidney and ureter, unspecified: Secondary | ICD-10-CM

## 2022-04-14 DIAGNOSIS — Z23 Encounter for immunization: Secondary | ICD-10-CM

## 2022-04-14 DIAGNOSIS — T148XXA Other injury of unspecified body region, initial encounter: Secondary | ICD-10-CM | POA: Diagnosis present

## 2022-04-14 DIAGNOSIS — S36113A Laceration of liver, unspecified degree, initial encounter: Secondary | ICD-10-CM

## 2022-04-14 HISTORY — PX: LAPAROTOMY: SHX154

## 2022-04-14 LAB — I-STAT CHEM 8, ED
BUN: 7 mg/dL (ref 6–20)
Calcium, Ion: 1.04 mmol/L — ABNORMAL LOW (ref 1.15–1.40)
Chloride: 104 mmol/L (ref 98–111)
Creatinine, Ser: 1.7 mg/dL — ABNORMAL HIGH (ref 0.61–1.24)
Glucose, Bld: 103 mg/dL — ABNORMAL HIGH (ref 70–99)
HCT: 49 % (ref 39.0–52.0)
Hemoglobin: 16.7 g/dL (ref 13.0–17.0)
Potassium: 2.9 mmol/L — ABNORMAL LOW (ref 3.5–5.1)
Sodium: 141 mmol/L (ref 135–145)
TCO2: 21 mmol/L — ABNORMAL LOW (ref 22–32)

## 2022-04-14 LAB — BPAM CRYOPRECIPITATE
Blood Product Expiration Date: 202311170005
Blood Product Expiration Date: 202311170049
ISSUE DATE / TIME: 202311161828
ISSUE DATE / TIME: 202311161911
Unit Type and Rh: 5100
Unit Type and Rh: 5100

## 2022-04-14 LAB — POCT I-STAT 7, (LYTES, BLD GAS, ICA,H+H)
Acid-base deficit: 11 mmol/L — ABNORMAL HIGH (ref 0.0–2.0)
Acid-base deficit: 12 mmol/L — ABNORMAL HIGH (ref 0.0–2.0)
Acid-base deficit: 13 mmol/L — ABNORMAL HIGH (ref 0.0–2.0)
Acid-base deficit: 13 mmol/L — ABNORMAL HIGH (ref 0.0–2.0)
Acid-base deficit: 15 mmol/L — ABNORMAL HIGH (ref 0.0–2.0)
Bicarbonate: 14.3 mmol/L — ABNORMAL LOW (ref 20.0–28.0)
Bicarbonate: 14.8 mmol/L — ABNORMAL LOW (ref 20.0–28.0)
Bicarbonate: 16.3 mmol/L — ABNORMAL LOW (ref 20.0–28.0)
Bicarbonate: 13.5 mmol/L — ABNORMAL LOW (ref 20.0–28.0)
Bicarbonate: 14.2 mmol/L — ABNORMAL LOW (ref 20.0–28.0)
Calcium, Ion: 0.54 mmol/L — CL (ref 1.15–1.40)
Calcium, Ion: 0.79 mmol/L — CL (ref 1.15–1.40)
Calcium, Ion: 1.12 mmol/L — ABNORMAL LOW (ref 1.15–1.40)
Calcium, Ion: 0.52 mmol/L — CL (ref 1.15–1.40)
Calcium, Ion: <0.3 mmol/L — CL (ref 1.15–1.40)
HCT: 23 % — ABNORMAL LOW (ref 39.0–52.0)
HCT: 25 % — ABNORMAL LOW (ref 39.0–52.0)
HCT: 31 % — ABNORMAL LOW (ref 39.0–52.0)
HCT: 20 % — ABNORMAL LOW (ref 39.0–52.0)
HCT: 24 % — ABNORMAL LOW (ref 39.0–52.0)
Hemoglobin: 10.5 g/dL — ABNORMAL LOW (ref 13.0–17.0)
Hemoglobin: 7.8 g/dL — ABNORMAL LOW (ref 13.0–17.0)
Hemoglobin: 8.5 g/dL — ABNORMAL LOW (ref 13.0–17.0)
Hemoglobin: 6.8 g/dL — CL (ref 13.0–17.0)
Hemoglobin: 8.2 g/dL — ABNORMAL LOW (ref 13.0–17.0)
O2 Saturation: 100 %
O2 Saturation: 100 %
O2 Saturation: 97 %
O2 Saturation: 100 %
O2 Saturation: 100 %
Patient temperature: 35.7
Patient temperature: 36
Patient temperature: 35.7
Potassium: 3.5 mmol/L (ref 3.5–5.1)
Potassium: 3.9 mmol/L (ref 3.5–5.1)
Potassium: 3.9 mmol/L (ref 3.5–5.1)
Potassium: 4.1 mmol/L (ref 3.5–5.1)
Potassium: 7.8 mmol/L (ref 3.5–5.1)
Sodium: 143 mmol/L (ref 135–145)
Sodium: 144 mmol/L (ref 135–145)
Sodium: 145 mmol/L (ref 135–145)
Sodium: 140 mmol/L (ref 135–145)
Sodium: 143 mmol/L (ref 135–145)
TCO2: 15 mmol/L — ABNORMAL LOW (ref 22–32)
TCO2: 16 mmol/L — ABNORMAL LOW (ref 22–32)
TCO2: 18 mmol/L — ABNORMAL LOW (ref 22–32)
TCO2: 15 mmol/L — ABNORMAL LOW (ref 22–32)
TCO2: 15 mmol/L — ABNORMAL LOW (ref 22–32)
pCO2 arterial: 34.6 mmHg (ref 32–48)
pCO2 arterial: 35.6 mmHg (ref 32–48)
pCO2 arterial: 42.6 mmHg (ref 32–48)
pCO2 arterial: 36.4 mmHg (ref 32–48)
pCO2 arterial: 44.3 mmHg (ref 32–48)
pH, Arterial: 7.19 — CL (ref 7.35–7.45)
pH, Arterial: 7.219 — ABNORMAL LOW (ref 7.35–7.45)
pH, Arterial: 7.22 — ABNORMAL LOW (ref 7.35–7.45)
pH, Arterial: 7.09 — CL (ref 7.35–7.45)
pH, Arterial: 7.193 — CL (ref 7.35–7.45)
pO2, Arterial: 196 mmHg — ABNORMAL HIGH (ref 83–108)
pO2, Arterial: 368 mmHg — ABNORMAL HIGH (ref 83–108)
pO2, Arterial: 96 mmHg (ref 83–108)
pO2, Arterial: 441 mmHg — ABNORMAL HIGH (ref 83–108)
pO2, Arterial: 459 mmHg — ABNORMAL HIGH (ref 83–108)

## 2022-04-14 LAB — CBC
HCT: 47.3 % (ref 39.0–52.0)
Hemoglobin: 15.4 g/dL (ref 13.0–17.0)
MCH: 30.5 pg (ref 26.0–34.0)
MCHC: 32.6 g/dL (ref 30.0–36.0)
MCV: 93.7 fL (ref 80.0–100.0)
Platelets: 161 10*3/uL (ref 150–400)
RBC: 5.05 MIL/uL (ref 4.22–5.81)
RDW: 14.6 % (ref 11.5–15.5)
WBC: 8.5 10*3/uL (ref 4.0–10.5)
nRBC: 0 % (ref 0.0–0.2)

## 2022-04-14 LAB — COMPREHENSIVE METABOLIC PANEL
ALT: 16 U/L (ref 0–44)
AST: 29 U/L (ref 15–41)
Albumin: 3.2 g/dL — ABNORMAL LOW (ref 3.5–5.0)
Alkaline Phosphatase: 32 U/L — ABNORMAL LOW (ref 38–126)
Anion gap: 13 (ref 5–15)
BUN: 8 mg/dL (ref 6–20)
CO2: 20 mmol/L — ABNORMAL LOW (ref 22–32)
Calcium: 8.2 mg/dL — ABNORMAL LOW (ref 8.9–10.3)
Chloride: 107 mmol/L (ref 98–111)
Creatinine, Ser: 1.47 mg/dL — ABNORMAL HIGH (ref 0.61–1.24)
GFR, Estimated: 56 mL/min — ABNORMAL LOW (ref >60)
Glucose, Bld: 106 mg/dL — ABNORMAL HIGH (ref 70–99)
Potassium: 2.9 mmol/L — ABNORMAL LOW (ref 3.5–5.1)
Sodium: 140 mmol/L (ref 135–145)
Total Bilirubin: 0.6 mg/dL (ref 0.3–1.2)
Total Protein: 5.5 g/dL — ABNORMAL LOW (ref 6.5–8.1)

## 2022-04-14 LAB — PREPARE CRYOPRECIPITATE
Unit division: 0
Unit division: 0

## 2022-04-14 LAB — PROTIME-INR
INR: 1.1 (ref 0.8–1.2)
Prothrombin Time: 13.7 seconds (ref 11.4–15.2)

## 2022-04-14 LAB — ABO/RH
ABO/RH(D): B NEG
Weak D: NEGATIVE

## 2022-04-14 LAB — SAMPLE TO BLOOD BANK

## 2022-04-14 LAB — LACTIC ACID, PLASMA: Lactic Acid, Venous: 4.7 mmol/L (ref 0.5–1.9)

## 2022-04-14 SURGERY — LAPAROTOMY, EXPLORATORY
Anesthesia: General | Site: Abdomen

## 2022-04-14 MED ORDER — VASOPRESSIN 20 UNIT/ML IV SOLN
INTRAVENOUS | Status: DC | PRN
  Administered 2022-04-14: 1 [IU] via INTRAVENOUS
  Administered 2022-04-14: 5 [IU] via INTRAVENOUS
  Administered 2022-04-14: 1 [IU] via INTRAVENOUS
  Administered 2022-04-14 (×2): 5 [IU] via INTRAVENOUS
  Administered 2022-04-14: 1 [IU] via INTRAVENOUS
  Administered 2022-04-14: 5 [IU] via INTRAVENOUS
  Administered 2022-04-14 (×2): 1 [IU] via INTRAVENOUS
  Administered 2022-04-14 (×3): 5 [IU] via INTRAVENOUS

## 2022-04-14 MED ORDER — EPHEDRINE SULFATE-NACL 50-0.9 MG/10ML-% IV SOSY
PREFILLED_SYRINGE | INTRAVENOUS | Status: DC | PRN
  Administered 2022-04-14: 25 mg via INTRAVENOUS

## 2022-04-14 MED ORDER — CALCIUM CHLORIDE 10 % IV SOLN
INTRAVENOUS | Status: DC | PRN
  Administered 2022-04-14 (×7): 1 g via INTRAVENOUS

## 2022-04-14 MED ORDER — CEFAZOLIN SODIUM 1 G IJ SOLR
INTRAMUSCULAR | Status: AC
  Filled 2022-04-14: qty 20

## 2022-04-14 MED ORDER — LACTATED RINGERS IV SOLN: INTRAVENOUS | Status: DC | PRN

## 2022-04-14 MED ORDER — MORPHINE SULFATE (PF) 2 MG/ML IV SOLN: 2.0000 mg | INTRAVENOUS | Status: DC | PRN

## 2022-04-14 MED ORDER — POTASSIUM CHLORIDE 10 MEQ/100ML IV SOLN: 10.0000 meq | INTRAVENOUS | Status: AC

## 2022-04-14 MED ORDER — LACTATED RINGERS IV SOLN: INTRAVENOUS | Status: DC

## 2022-04-14 MED ORDER — DEXTROSE 50 % IV SOLN
INTRAVENOUS | Status: AC
  Filled 2022-04-14: qty 50

## 2022-04-14 MED ORDER — CEFAZOLIN SODIUM-DEXTROSE 2-4 GM/100ML-% IV SOLN
2.0000 g | Freq: Once | INTRAVENOUS | Status: AC
  Administered 2022-04-14: 2 g via INTRAVENOUS

## 2022-04-14 MED ORDER — CALCIUM CHLORIDE 10 % IV SOLN
INTRAVENOUS | Status: AC
  Filled 2022-04-14: qty 20

## 2022-04-14 MED ORDER — MIDAZOLAM HCL 2 MG/2ML IJ SOLN
INTRAMUSCULAR | Status: AC
  Filled 2022-04-14: qty 2

## 2022-04-14 MED ORDER — PHENYLEPHRINE 80 MCG/ML (10ML) SYRINGE FOR IV PUSH (FOR BLOOD PRESSURE SUPPORT)
PREFILLED_SYRINGE | INTRAVENOUS | Status: AC
  Filled 2022-04-14: qty 40

## 2022-04-14 MED ORDER — DOCUSATE SODIUM 50 MG/5ML PO LIQD: 100.0000 mg | Freq: Two times a day (BID) | ORAL | Status: DC

## 2022-04-14 MED ORDER — TETANUS-DIPHTH-ACELL PERTUSSIS 5-2.5-18.5 LF-MCG/0.5 IM SUSY
0.5000 mL | PREFILLED_SYRINGE | Freq: Once | INTRAMUSCULAR | Status: AC
  Administered 2022-04-14: 0.5 mL via INTRAMUSCULAR

## 2022-04-14 MED ORDER — SODIUM CHLORIDE 0.9% FLUSH: 3.0000 mL | INTRAVENOUS | Status: DC | PRN

## 2022-04-14 MED ORDER — EPINEPHRINE 1 MG/10ML IJ SOSY
PREFILLED_SYRINGE | INTRAMUSCULAR | Status: DC | PRN
  Administered 2022-04-14: .2 mg via INTRAVENOUS

## 2022-04-14 MED ORDER — ROCURONIUM BROMIDE 10 MG/ML (PF) SYRINGE
PREFILLED_SYRINGE | INTRAVENOUS | Status: DC | PRN
  Administered 2022-04-14: 50 mg via INTRAVENOUS
  Administered 2022-04-14: 100 mg via INTRAVENOUS
  Administered 2022-04-14: 50 mg via INTRAVENOUS

## 2022-04-14 MED ORDER — SODIUM CHLORIDE 0.9 % IV SOLN
INTRAVENOUS | Status: AC | PRN
  Administered 2022-04-14: 1000 mL via INTRAVENOUS

## 2022-04-14 MED ORDER — NOREPINEPHRINE 4 MG/250ML-% IV SOLN
INTRAVENOUS | Status: DC | PRN
  Administered 2022-04-14: 5 ug/min via INTRAVENOUS

## 2022-04-14 MED ORDER — SODIUM BICARBONATE 8.4 % IV SOLN
INTRAVENOUS | Status: DC | PRN
  Administered 2022-04-14: 50 meq via INTRAVENOUS

## 2022-04-14 MED ORDER — ROCURONIUM BROMIDE 10 MG/ML (PF) SYRINGE
PREFILLED_SYRINGE | INTRAVENOUS | Status: AC
  Filled 2022-04-14: qty 20

## 2022-04-14 MED ORDER — FENTANYL CITRATE (PF) 250 MCG/5ML IJ SOLN
INTRAMUSCULAR | Status: DC | PRN
  Administered 2022-04-14: 50 ug via INTRAVENOUS
  Administered 2022-04-14: 100 ug via INTRAVENOUS
  Administered 2022-04-14: 50 ug via INTRAVENOUS

## 2022-04-14 MED ORDER — LIDOCAINE 2% (20 MG/ML) 5 ML SYRINGE
INTRAMUSCULAR | Status: DC | PRN
  Administered 2022-04-14: 100 mg via INTRAVENOUS

## 2022-04-14 MED ORDER — SODIUM CHLORIDE 0.9% FLUSH: 3.0000 mL | Freq: Two times a day (BID) | INTRAVENOUS | Status: DC

## 2022-04-14 MED ORDER — FENTANYL 2500MCG IN NS 250ML (10MCG/ML) PREMIX INFUSION
0.0000 ug/h | INTRAVENOUS | Status: DC
  Administered 2022-04-14: 50 ug/h via INTRAVENOUS

## 2022-04-14 MED ORDER — SODIUM CHLORIDE 0.9 % IV SOLN: 250.0000 mL | INTRAVENOUS | Status: DC | PRN

## 2022-04-14 MED ORDER — MIDAZOLAM HCL 2 MG/2ML IJ SOLN
INTRAMUSCULAR | Status: DC | PRN
  Administered 2022-04-14: 2 mg via INTRAVENOUS

## 2022-04-14 MED ORDER — PHENYLEPHRINE 80 MCG/ML (10ML) SYRINGE FOR IV PUSH (FOR BLOOD PRESSURE SUPPORT)
PREFILLED_SYRINGE | INTRAVENOUS | Status: DC | PRN
  Administered 2022-04-14 (×2): 160 ug via INTRAVENOUS
  Administered 2022-04-14: 240 ug via INTRAVENOUS

## 2022-04-14 MED ORDER — PHENYLEPHRINE HCL-NACL 20-0.9 MG/250ML-% IV SOLN
INTRAVENOUS | Status: DC | PRN
  Administered 2022-04-14: 50 ug/min via INTRAVENOUS

## 2022-04-14 MED ORDER — ETOMIDATE 2 MG/ML IV SOLN
INTRAVENOUS | Status: DC | PRN
  Administered 2022-04-14: 20 mg via INTRAVENOUS

## 2022-04-14 MED ORDER — ALBUMIN HUMAN 5 % IV SOLN: INTRAVENOUS | Status: DC | PRN

## 2022-04-14 MED ORDER — DOCUSATE SODIUM 100 MG PO CAPS: 100.0000 mg | ORAL_CAPSULE | Freq: Two times a day (BID) | ORAL | Status: DC

## 2022-04-14 MED ORDER — HEMOSTATIC AGENTS (NO CHARGE) OPTIME
TOPICAL | Status: DC | PRN
  Administered 2022-04-14 (×5): 1 via TOPICAL

## 2022-04-14 MED ORDER — ACETAMINOPHEN 325 MG PO TABS: 650.0000 mg | ORAL_TABLET | ORAL | Status: DC | PRN

## 2022-04-14 MED ORDER — IOHEXOL 350 MG/ML SOLN
75.0000 mL | Freq: Once | INTRAVENOUS | Status: AC | PRN
  Administered 2022-04-14: 75 mL via INTRAVENOUS

## 2022-04-14 MED ORDER — EPHEDRINE 5 MG/ML INJ
INTRAVENOUS | Status: AC
  Filled 2022-04-14: qty 10

## 2022-04-14 MED ORDER — CEFAZOLIN SODIUM-DEXTROSE 2-3 GM-%(50ML) IV SOLR
INTRAVENOUS | Status: DC | PRN
  Administered 2022-04-14: 2 g via INTRAVENOUS

## 2022-04-14 MED ORDER — 0.9 % SODIUM CHLORIDE (POUR BTL) OPTIME
TOPICAL | Status: DC | PRN
  Administered 2022-04-14: 1000 mL

## 2022-04-14 MED ORDER — POLYETHYLENE GLYCOL 3350 17 G PO PACK: 17.0000 g | PACK | Freq: Every day | ORAL | Status: DC

## 2022-04-14 MED ORDER — VASOPRESSIN 20 UNIT/ML IV SOLN
INTRAVENOUS | Status: AC
  Filled 2022-04-14: qty 1

## 2022-04-14 MED ORDER — FENTANYL CITRATE (PF) 250 MCG/5ML IJ SOLN
INTRAMUSCULAR | Status: AC
  Filled 2022-04-14: qty 5

## 2022-04-14 MED ORDER — SUCCINYLCHOLINE CHLORIDE 200 MG/10ML IV SOSY
PREFILLED_SYRINGE | INTRAVENOUS | Status: AC
  Filled 2022-04-14: qty 10

## 2022-04-14 MED ORDER — SUCCINYLCHOLINE CHLORIDE 200 MG/10ML IV SOSY
PREFILLED_SYRINGE | INTRAVENOUS | Status: DC | PRN
  Administered 2022-04-14: 100 mg via INTRAVENOUS

## 2022-04-14 SURGICAL SUPPLY — 59 items
APL PRP STRL LF DISP 70% ISPRP (MISCELLANEOUS) ×1
APL SKNCLS STERI-STRIP NONHPOA (GAUZE/BANDAGES/DRESSINGS) ×2
APPLIER CLIP ROT 10 11.4 M/L (STAPLE) ×1
APR CLP MED LRG 11.4X10 (STAPLE) ×1
BAG COUNTER SPONGE SURGICOUNT (BAG) ×1 IMPLANT
BAG SPNG CNTER NS LX DISP (BAG) ×1
BENZOIN TINCTURE PRP APPL 2/3 (GAUZE/BANDAGES/DRESSINGS) IMPLANT
BLADE CLIPPER SURG (BLADE) IMPLANT
CANISTER SUCT 3000ML PPV (MISCELLANEOUS) ×1 IMPLANT
CHLORAPREP W/TINT 26 (MISCELLANEOUS) ×1 IMPLANT
CLIP APPLIE ROT 10 11.4 M/L (STAPLE) IMPLANT
COVER SURGICAL LIGHT HANDLE (MISCELLANEOUS) ×1 IMPLANT
DRAPE LAPAROSCOPIC ABDOMINAL (DRAPES) ×1 IMPLANT
DRAPE WARM FLUID 44X44 (DRAPES) ×1 IMPLANT
DRESSING QUICKCLOT CNTRL 12X12 (GAUZE/BANDAGES/DRESSINGS) IMPLANT
DRESSING QUICKCLOT CNTRL ZFOLD (GAUZE/BANDAGES/DRESSINGS) IMPLANT
DRSG MEPILEX POST OP 4X12 (GAUZE/BANDAGES/DRESSINGS) IMPLANT
DRSG OPSITE POSTOP 4X10 (GAUZE/BANDAGES/DRESSINGS) IMPLANT
DRSG OPSITE POSTOP 4X8 (GAUZE/BANDAGES/DRESSINGS) IMPLANT
DRSG QUICKCLOT CNTRL 12X12 (GAUZE/BANDAGES/DRESSINGS)
DRSG QUICKCLOT CNTRL ZFOLD (GAUZE/BANDAGES/DRESSINGS) ×2
ELECT BLADE 6.5 EXT (BLADE) IMPLANT
ELECT CAUTERY BLADE 6.4 (BLADE) ×1 IMPLANT
ELECT PAD DSPR THERM+ ADLT (MISCELLANEOUS) IMPLANT
ELECT REM PT RETURN 9FT ADLT (ELECTROSURGICAL) ×1
ELECTRODE REM PT RTRN 9FT ADLT (ELECTROSURGICAL) ×1 IMPLANT
GLOVE BIO SURGEON STRL SZ8 (GLOVE) ×1 IMPLANT
GLOVE BIOGEL PI IND STRL 8 (GLOVE) ×1 IMPLANT
GOWN STRL REUS W/ TWL LRG LVL3 (GOWN DISPOSABLE) ×1 IMPLANT
GOWN STRL REUS W/ TWL XL LVL3 (GOWN DISPOSABLE) ×1 IMPLANT
GOWN STRL REUS W/TWL LRG LVL3 (GOWN DISPOSABLE) ×1
GOWN STRL REUS W/TWL XL LVL3 (GOWN DISPOSABLE) ×1
HAND PENCIL TRP OPTION (MISCELLANEOUS) IMPLANT
HANDLE SUCTION POOLE (INSTRUMENTS) ×1 IMPLANT
HEMOSTAT SNOW SURGICEL 2X4 (HEMOSTASIS) IMPLANT
KIT BASIN OR (CUSTOM PROCEDURE TRAY) ×1 IMPLANT
KIT TURNOVER KIT B (KITS) ×1 IMPLANT
LIGASURE IMPACT 36 18CM CVD LR (INSTRUMENTS) IMPLANT
NS IRRIG 1000ML POUR BTL (IV SOLUTION) ×2 IMPLANT
PACK GENERAL/GYN (CUSTOM PROCEDURE TRAY) ×1 IMPLANT
PAD ARMBOARD 7.5X6 YLW CONV (MISCELLANEOUS) ×1 IMPLANT
PENCIL SMOKE EVACUATOR (MISCELLANEOUS) ×1 IMPLANT
SPECIMEN JAR LARGE (MISCELLANEOUS) IMPLANT
SPONGE ABDOMINAL VAC ABTHERA (MISCELLANEOUS) IMPLANT
SPONGE T-LAP 18X18 ~~LOC~~+RFID (SPONGE) IMPLANT
STAPLER VISISTAT 35W (STAPLE) ×1 IMPLANT
SUCTION POOLE HANDLE (INSTRUMENTS) ×1
SUT CHROMIC 0 BP (SUTURE) IMPLANT
SUT ETHILON 2 LR (SUTURE) IMPLANT
SUT PDS AB 0 CT 36 (SUTURE) IMPLANT
SUT PDS AB 1 TP1 96 (SUTURE) ×2 IMPLANT
SUT SILK 2 0 SH CR/8 (SUTURE) ×1 IMPLANT
SUT SILK 2 0 TIES 10X30 (SUTURE) ×1 IMPLANT
SUT SILK 3 0 SH CR/8 (SUTURE) ×1 IMPLANT
SUT SILK 3 0 TIES 10X30 (SUTURE) ×1 IMPLANT
SUT SILK 3 0SH CR/8 30 (SUTURE) IMPLANT
TOWEL GREEN STERILE (TOWEL DISPOSABLE) ×1 IMPLANT
TRAY FOLEY MTR SLVR 16FR STAT (SET/KITS/TRAYS/PACK) IMPLANT
YANKAUER SUCT BULB TIP NO VENT (SUCTIONS) IMPLANT

## 2022-04-15 ENCOUNTER — Encounter (HOSPITAL_COMMUNITY): Payer: Self-pay | Admitting: Student in an Organized Health Care Education/Training Program

## 2022-04-15 ENCOUNTER — Encounter (HOSPITAL_COMMUNITY): Payer: Self-pay | Admitting: General Surgery

## 2022-04-15 ENCOUNTER — Other Ambulatory Visit: Payer: Self-pay

## 2022-04-15 LAB — BPAM FFP
Blood Product Expiration Date: 202311212359
Blood Product Expiration Date: 202311212359
Blood Product Expiration Date: 202311212359
Blood Product Expiration Date: 202311212359
Blood Product Expiration Date: 202311212359
Blood Product Expiration Date: 202311212359
Blood Product Expiration Date: 202311212359
Blood Product Expiration Date: 202311212359
Blood Product Expiration Date: 202311212359
Blood Product Expiration Date: 202311212359
Blood Product Expiration Date: 202311212359
Blood Product Expiration Date: 202311212359
Blood Product Expiration Date: 202311272359
Blood Product Expiration Date: 202311272359
Blood Product Expiration Date: 202311292359
Blood Product Expiration Date: 202312022359
Blood Product Expiration Date: 202312032359
Blood Product Expiration Date: 202312042359
Blood Product Expiration Date: 202312042359
Blood Product Expiration Date: 202312042359
ISSUE DATE / TIME: 202311161740
ISSUE DATE / TIME: 202311161740
ISSUE DATE / TIME: 202311161740
ISSUE DATE / TIME: 202311161740
ISSUE DATE / TIME: 202311161740
ISSUE DATE / TIME: 202311161740
ISSUE DATE / TIME: 202311161740
ISSUE DATE / TIME: 202311161740
ISSUE DATE / TIME: 202311161802
ISSUE DATE / TIME: 202311161802
ISSUE DATE / TIME: 202311161824
ISSUE DATE / TIME: 202311161824
ISSUE DATE / TIME: 202311161824
ISSUE DATE / TIME: 202311161824
ISSUE DATE / TIME: 202311161848
ISSUE DATE / TIME: 202311161848
ISSUE DATE / TIME: 202311161848
ISSUE DATE / TIME: 202311161848
ISSUE DATE / TIME: 202311161912
ISSUE DATE / TIME: 202311170307
Unit Type and Rh: 1700
Unit Type and Rh: 2800
Unit Type and Rh: 600
Unit Type and Rh: 6200
Unit Type and Rh: 6200
Unit Type and Rh: 6200
Unit Type and Rh: 6200
Unit Type and Rh: 6200
Unit Type and Rh: 6200
Unit Type and Rh: 6200
Unit Type and Rh: 6200
Unit Type and Rh: 6200
Unit Type and Rh: 7300
Unit Type and Rh: 7300
Unit Type and Rh: 7300
Unit Type and Rh: 7300
Unit Type and Rh: 8400
Unit Type and Rh: 8400
Unit Type and Rh: 8400
Unit Type and Rh: 8400

## 2022-04-15 LAB — PREPARE FRESH FROZEN PLASMA
Unit division: 0
Unit division: 0
Unit division: 0
Unit division: 0
Unit division: 0
Unit division: 0
Unit division: 0
Unit division: 0
Unit division: 0
Unit division: 0
Unit division: 0
Unit division: 0
Unit division: 0
Unit division: 0
Unit division: 0
Unit division: 0

## 2022-04-15 LAB — BPAM PLATELET PHERESIS
Blood Product Expiration Date: 202311192359
Blood Product Expiration Date: 202311192359
ISSUE DATE / TIME: 202311161739
ISSUE DATE / TIME: 202311170054
Unit Type and Rh: 5100
Unit Type and Rh: 5100

## 2022-04-15 LAB — PREPARE PLATELET PHERESIS
Unit division: 0
Unit division: 0

## 2022-04-15 LAB — BLOOD PRODUCT ORDER (VERBAL) VERIFICATION

## 2022-04-15 NOTE — Anesthesia Postprocedure Evaluation (Signed)
Anesthesia Post Note  Patient: Donald French  Procedure(s) Performed: EXPLORATORY LAPAROTOMY/HEPATORRAPHY (Abdomen)     Anesthesia Type: General Anesthetic complications: no   Pt is deceased.             Germaine Pomfret

## 2022-04-16 LAB — BPAM RBC
Blood Product Expiration Date: 202311232359
Blood Product Expiration Date: 202311232359
Blood Product Expiration Date: 202311242359
Blood Product Expiration Date: 202311272359
Blood Product Expiration Date: 202311282359
Blood Product Expiration Date: 202311282359
Blood Product Expiration Date: 202311282359
Blood Product Expiration Date: 202311282359
Blood Product Expiration Date: 202311282359
Blood Product Expiration Date: 202311282359
Blood Product Expiration Date: 202311282359
Blood Product Expiration Date: 202311302359
Blood Product Expiration Date: 202311302359
Blood Product Expiration Date: 202311302359
Blood Product Expiration Date: 202311302359
Blood Product Expiration Date: 202311302359
Blood Product Expiration Date: 202312032359
Blood Product Expiration Date: 202312032359
Blood Product Expiration Date: 202312032359
Blood Product Expiration Date: 202312032359
Blood Product Expiration Date: 202312052359
Blood Product Expiration Date: 202312052359
Blood Product Expiration Date: 202312052359
Blood Product Expiration Date: 202312052359
Blood Product Expiration Date: 202312052359
Blood Product Expiration Date: 202312062359
Blood Product Expiration Date: 202312082359
Blood Product Expiration Date: 202312092359
Blood Product Expiration Date: 202312102359
Blood Product Expiration Date: 202312102359
Blood Product Expiration Date: 202312112359
Blood Product Expiration Date: 202312122359
Blood Product Expiration Date: 202312122359
Blood Product Expiration Date: 202312122359
Blood Product Expiration Date: 202312122359
Blood Product Expiration Date: 202312132359
Blood Product Expiration Date: 202312132359
Blood Product Expiration Date: 202312132359
Blood Product Expiration Date: 202312132359
Blood Product Expiration Date: 202312142359
ISSUE DATE / TIME: 202311161740
ISSUE DATE / TIME: 202311161740
ISSUE DATE / TIME: 202311161740
ISSUE DATE / TIME: 202311161740
ISSUE DATE / TIME: 202311161740
ISSUE DATE / TIME: 202311161740
ISSUE DATE / TIME: 202311161740
ISSUE DATE / TIME: 202311161740
ISSUE DATE / TIME: 202311161759
ISSUE DATE / TIME: 202311161759
ISSUE DATE / TIME: 202311161759
ISSUE DATE / TIME: 202311161759
ISSUE DATE / TIME: 202311161759
ISSUE DATE / TIME: 202311161759
ISSUE DATE / TIME: 202311161759
ISSUE DATE / TIME: 202311161759
ISSUE DATE / TIME: 202311161822
ISSUE DATE / TIME: 202311161822
ISSUE DATE / TIME: 202311161822
ISSUE DATE / TIME: 202311161822
ISSUE DATE / TIME: 202311161822
ISSUE DATE / TIME: 202311161822
ISSUE DATE / TIME: 202311161822
ISSUE DATE / TIME: 202311161822
ISSUE DATE / TIME: 202311161840
ISSUE DATE / TIME: 202311161840
ISSUE DATE / TIME: 202311161840
ISSUE DATE / TIME: 202311161840
ISSUE DATE / TIME: 202311161908
ISSUE DATE / TIME: 202311161908
ISSUE DATE / TIME: 202311162205
ISSUE DATE / TIME: 202311162205
ISSUE DATE / TIME: 202311170035
ISSUE DATE / TIME: 202311170915
ISSUE DATE / TIME: 202311171302
ISSUE DATE / TIME: 202311171302
ISSUE DATE / TIME: 202311171533
ISSUE DATE / TIME: 202311171617
ISSUE DATE / TIME: 202311171633
ISSUE DATE / TIME: 202311181058
Unit Type and Rh: 5100
Unit Type and Rh: 5100
Unit Type and Rh: 5100
Unit Type and Rh: 5100
Unit Type and Rh: 5100
Unit Type and Rh: 5100
Unit Type and Rh: 5100
Unit Type and Rh: 5100
Unit Type and Rh: 5100
Unit Type and Rh: 5100
Unit Type and Rh: 5100
Unit Type and Rh: 5100
Unit Type and Rh: 5100
Unit Type and Rh: 5100
Unit Type and Rh: 5100
Unit Type and Rh: 5100
Unit Type and Rh: 5100
Unit Type and Rh: 5100
Unit Type and Rh: 5100
Unit Type and Rh: 5100
Unit Type and Rh: 5100
Unit Type and Rh: 5100
Unit Type and Rh: 7300
Unit Type and Rh: 7300
Unit Type and Rh: 7300
Unit Type and Rh: 7300
Unit Type and Rh: 7300
Unit Type and Rh: 7300
Unit Type and Rh: 7300
Unit Type and Rh: 7300
Unit Type and Rh: 7300
Unit Type and Rh: 7300
Unit Type and Rh: 7300
Unit Type and Rh: 7300
Unit Type and Rh: 7300
Unit Type and Rh: 7300
Unit Type and Rh: 7300
Unit Type and Rh: 7300
Unit Type and Rh: 7300
Unit Type and Rh: 7300

## 2022-04-16 LAB — TYPE AND SCREEN
ABO/RH(D): B NEG
Antibody Screen: NEGATIVE
Unit division: 0
Unit division: 0
Unit division: 0
Unit division: 0
Unit division: 0
Unit division: 0
Unit division: 0
Unit division: 0
Unit division: 0
Unit division: 0
Unit division: 0
Unit division: 0
Unit division: 0
Unit division: 0
Unit division: 0
Unit division: 0
Unit division: 0
Unit division: 0
Unit division: 0
Unit division: 0
Unit division: 0
Unit division: 0
Unit division: 0
Unit division: 0
Unit division: 0
Unit division: 0
Unit division: 0
Unit division: 0
Unit division: 0
Unit division: 0
Unit division: 0
Unit division: 0
Unit division: 0
Unit division: 0
Unit division: 0
Unit division: 0
Unit division: 0
Unit division: 0
Unit division: 0
Unit division: 0
Weak D: NEGATIVE

## 2022-04-20 ENCOUNTER — Encounter (HOSPITAL_COMMUNITY): Payer: Self-pay | Admitting: Psychiatry

## 2022-04-26 ENCOUNTER — Encounter (HOSPITAL_COMMUNITY): Payer: No Payment, Other | Admitting: Student in an Organized Health Care Education/Training Program

## 2022-04-29 NOTE — Progress Notes (Signed)
Wasted 250 mls of fentanyl with Idelle Crouch RN

## 2022-04-29 NOTE — ED Notes (Signed)
Consent for surgery signed by the pt, witnessed by Elexes RN & Lanetta Inch, signed by Janee Morn MD & paper form will remain at bedside with pt.
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
## 2022-04-29 NOTE — Transfer of Care (Signed)
Immediate Anesthesia Transfer of Care Note  Patient: Donald French  Procedure(s) Performed: EXPLORATORY LAPAROTOMY/HEPATORRAPHY (Abdomen)  Patient Location: ICU  Anesthesia Type:General  Level of Consciousness: Patient remains intubated per anesthesia plan  Airway & Oxygen Therapy: Patient remains intubated per anesthesia plan and Patient placed on Ventilator (see vital sign flow sheet for setting)  Post-op Assessment: Report given to RN and Post -op Vital signs reviewed and unstable, Anesthesiologist notified  Post vital signs: Reviewed and unstable  Last Vitals:  Vitals Value Taken Time  BP 59/37 04/16/2022 1958  Temp    Pulse 111 04-16-2022 2003  Resp 14 2022-04-16 2008  SpO2 100 % 2022/04/16 2003  Vitals shown include unvalidated device data.  Last Pain:  Vitals:   April 16, 2022 1644  TempSrc:   PainSc: 10-Worst pain ever         Complications: No notable events documented.
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
## 2022-04-29 NOTE — Anesthesia Procedure Notes (Signed)
Central Venous Catheter Insertion Performed by: Gaynelle Adu, MD, anesthesiologist Start/EndNov 26, 2023 5:00 PM, 2022/04/24 5:15 PM Patient location: Pre-op. Preanesthetic checklist: patient identified, IV checked, site marked, risks and benefits discussed, surgical consent, monitors and equipment checked, pre-op evaluation, timeout performed and anesthesia consent Position: Trendelenburg Lidocaine 1% used for infiltration and patient sedated Hand hygiene performed , maximum sterile barriers used  and Seldinger technique used Catheter size: 8.5 Fr Total catheter length 10. Central line was placed.Sheath introducer Procedure performed using ultrasound guided technique. Ultrasound Notes:anatomy identified, needle tip was noted to be adjacent to the nerve/plexus identified, no ultrasound evidence of intravascular and/or intraneural injection and image(s) printed for medical record Attempts: 1 Following insertion, line sutured, dressing applied and Biopatch. Post procedure assessment: blood return through all ports, free fluid flow and no air  Patient tolerated the procedure well with no immediate complications.
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
## 2022-04-29 NOTE — Anesthesia Preprocedure Evaluation (Addendum)
Anesthesia Evaluation  Patient identified by MRN, date of birth, ID band Patient awake    Reviewed: Allergy & Precautions, H&P , NPO status , Patient's Chart, lab work & pertinent test resultsPreop documentation limited or incomplete due to emergent nature of procedure.  Airway Mallampati: II  TM Distance: >3 FB Neck ROM: Full    Dental no notable dental hx. (+) Teeth Intact, Dental Advisory Given   Pulmonary neg pulmonary ROS   Pulmonary exam normal breath sounds clear to auscultation       Cardiovascular negative cardio ROS  Rhythm:Regular Rate:Normal     Neuro/Psych        schizophrenianegative neurological ROS  negative psych ROS   GI/Hepatic negative GI ROS, Neg liver ROS,,,  Endo/Other  negative endocrine ROS    Renal/GU Renal diseasenegative Renal ROS  negative genitourinary   Musculoskeletal   Abdominal   Peds  Hematology negative hematology ROS (+)   Anesthesia Other Findings EXPLORATORY LAPAROTOMY  Reproductive/Obstetrics negative OB ROS                             Anesthesia Physical Anesthesia Plan  ASA: 3 and emergent  Anesthesia Plan: General   Post-op Pain Management:    Induction: Intravenous  PONV Risk Score and Plan: 3 and Ondansetron, Dexamethasone, Midazolam and Treatment may vary due to age or medical condition  Airway Management Planned: Oral ETT  Additional Equipment: Arterial line and CVP  Intra-op Plan:   Post-operative Plan: Post-operative intubation/ventilation  Informed Consent: I have reviewed the patients History and Physical, chart, labs and discussed the procedure including the risks, benefits and alternatives for the proposed anesthesia with the patient or authorized representative who has indicated his/her understanding and acceptance.     Dental advisory given  Plan Discussed with: CRNA  Anesthesia Plan Comments:        Anesthesia  Quick Evaluation

## 2022-04-29 NOTE — ED Notes (Signed)
Updated friend Lafonda Mosses, she will be arriving after her dr visit.

## 2022-04-29 NOTE — OR Nursing (Signed)
Care of patient assumed at 1921. 

## 2022-04-29 NOTE — Progress Notes (Signed)
Patient brought up from OR with BP in the 40's by aline and barely palpable pulse.  According to CRNA and dr Sheliah Hatch bleeding from the liver was unable to be controlled so patient was made DNR and decision to close him up and bring to ICU was made.  Upon arrival started fentanyl for comfort and cleaned up patient.  No other interventions being done at this time per Trauma MD.  Will continue to monitor.
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
## 2022-04-29 NOTE — ED Notes (Signed)
Pt transported to OR

## 2022-04-29 NOTE — ED Notes (Signed)
Transported to CT with TRN, Trauma physician & all other staff.

## 2022-04-29 NOTE — Anesthesia Procedure Notes (Signed)
Procedure Name: Intubation Date/Time: 05-12-22 5:20 PM  Performed by: Lorie Phenix, CRNAPre-anesthesia Checklist: Patient identified, Emergency Drugs available, Suction available and Patient being monitored Patient Re-evaluated:Patient Re-evaluated prior to induction Oxygen Delivery Method: Circle system utilized Preoxygenation: Pre-oxygenation with 100% oxygen Induction Type: IV induction, Rapid sequence and Cricoid Pressure applied Laryngoscope Size: Mac and 4 Grade View: Grade I Tube type: Oral Tube size: 7.5 mm Number of attempts: 1 Airway Equipment and Method: Stylet Placement Confirmation: ETT inserted through vocal cords under direct vision, positive ETCO2 and breath sounds checked- equal and bilateral Secured at: 23 cm Tube secured with: Tape Dental Injury: Teeth and Oropharynx as per pre-operative assessment

## 2022-04-29 NOTE — Anesthesia Procedure Notes (Signed)
Arterial Line Insertion Start/End2023-12-05 6:25 PM, May 03, 2022 6:35 PM Performed by: Gaynelle Adu, MD, anesthesiologist  Patient location: Pre-op. Preanesthetic checklist: patient identified, IV checked, site marked, risks and benefits discussed, surgical consent, monitors and equipment checked, pre-op evaluation, timeout performed and anesthesia consent Lidocaine 1% used for infiltration Right, radial was placed Catheter size: 20 G Hand hygiene performed , maximum sterile barriers used  and Seldinger technique used  Attempts: 3 Procedure performed using ultrasound guided technique. Ultrasound Notes:anatomy identified, needle tip was noted to be adjacent to the nerve/plexus identified, no ultrasound evidence of intravascular and/or intraneural injection and image(s) printed for medical record Following insertion, dressing applied and Biopatch. Post procedure assessment: normal and unchanged  Post procedure complications: unsuccessful attempts and second provider assisted. Patient tolerated the procedure well with no immediate complications.

## 2022-04-29 NOTE — Progress Notes (Addendum)
   May 06, 2022 2035  Clinical Encounter Type  Visited With Patient not available  Visit Type Other (Comment);Post-op  Referral From Nurse  Consult/Referral To Chaplain   [facility] returned call to page [phone]. The RN provided an update reference to the patient's status and requested emotional support for the family. The nurse confirmed with the family if they wanted [facility] support. This CH could hear a male family member in the background stating they are Muslim and declining CH's support. CH informs the nurse, that this chaplain will be available to provide support as needed. This note was prepared by Deneen Harts, M.Div..  For questions please contact by phone [phone].

## 2022-04-29 NOTE — ED Notes (Addendum)
Attempted to call Lafonda Mosses (friend) with an update. Friend not available, VM left.
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
## 2022-04-29 NOTE — ED Provider Notes (Signed)
ATTENDING SUPERVISORY NOTE I have personally viewed the imaging studies performed. I have personally seen and examined the patient, and discussed the plan of care with the resident.  I have reviewed the documentation of the resident and agree.  No diagnosis found.  .Critical Care  Performed by: Blane Ohara, MD Authorized by: Blane Ohara, MD   Critical care provider statement:    Critical care time (minutes):  30   Critical care start time:  2022/04/15 4:17 PM   Critical care end time:  04-15-22 4:47 PM   Critical care time was exclusive of:  Separately billable procedures and treating other patients and teaching time   Critical care was necessary to treat or prevent imminent or life-threatening deterioration of the following conditions:  Trauma   Critical care was time spent personally by me on the following activities:  Development of treatment plan with patient or surrogate, discussions with consultants, evaluation of patient's response to treatment, examination of patient, ordering and review of laboratory studies, ordering and review of radiographic studies, ordering and performing treatments and interventions, pulse oximetry and re-evaluation of patient's condition     Blane Ohara, MD 04/15/22 0038
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
## 2022-04-29 DEATH — deceased

## 2022-05-25 ENCOUNTER — Ambulatory Visit: Payer: Medicaid Other | Admitting: Physician Assistant

## 2022-06-03 ENCOUNTER — Ambulatory Visit: Payer: Medicaid Other | Admitting: Internal Medicine
